# Patient Record
Sex: Female | Born: 1960
Health system: Southern US, Community
[De-identification: ages and names within clinical notes are randomized; demographics above are authoritative.]

## PROBLEM LIST (undated history)

## (undated) DIAGNOSIS — R011 Cardiac murmur, unspecified: Secondary | ICD-10-CM

## (undated) DIAGNOSIS — I10 Essential (primary) hypertension: Secondary | ICD-10-CM

## (undated) DIAGNOSIS — K219 Gastro-esophageal reflux disease without esophagitis: Secondary | ICD-10-CM

## (undated) DIAGNOSIS — R87629 Unspecified abnormal cytological findings in specimens from vagina: Secondary | ICD-10-CM

## (undated) DIAGNOSIS — T7840XA Allergy, unspecified, initial encounter: Secondary | ICD-10-CM

## (undated) DIAGNOSIS — M1990 Unspecified osteoarthritis, unspecified site: Secondary | ICD-10-CM

## (undated) DIAGNOSIS — K649 Unspecified hemorrhoids: Secondary | ICD-10-CM

## (undated) HISTORY — DX: Allergy, unspecified, initial encounter: T78.40XA

## (undated) HISTORY — DX: Unspecified osteoarthritis, unspecified site: M19.90

## (undated) HISTORY — PX: POLYPECTOMY: SHX149

## (undated) HISTORY — DX: Essential (primary) hypertension: I10

## (undated) HISTORY — DX: Unspecified hemorrhoids: K64.9

## (undated) HISTORY — PX: TUBAL LIGATION: SHX77

## (undated) HISTORY — PX: WISDOM TOOTH EXTRACTION: SHX21

## (undated) HISTORY — DX: Unspecified abnormal cytological findings in specimens from vagina: R87.629

## (undated) HISTORY — PX: APPENDECTOMY: SHX54

## (undated) HISTORY — DX: Gastro-esophageal reflux disease without esophagitis: K21.9

## (undated) HISTORY — DX: Cardiac murmur, unspecified: R01.1

## (undated) HISTORY — PX: COLPOSCOPY: SHX161

---

## 2012-05-10 HISTORY — PX: COLONOSCOPY: SHX174

## 2015-07-05 ENCOUNTER — Other Ambulatory Visit (HOSPITAL_COMMUNITY)
Admission: RE | Admit: 2015-07-05 | Discharge: 2015-07-05 | Disposition: A | Discharge status: Home / Self Care | Payer: Federal, State, Local not specified - PPO | Source: Ambulatory Visit | Attending: Nurse Practitioner | Admitting: Nurse Practitioner

## 2015-07-05 DIAGNOSIS — Z01419 Encounter for gynecological examination (general) (routine) without abnormal findings: Secondary | ICD-10-CM | POA: Insufficient documentation

## 2015-07-05 DIAGNOSIS — Z1151 Encounter for screening for human papillomavirus (HPV): Secondary | ICD-10-CM | POA: Insufficient documentation

## 2015-08-10 ENCOUNTER — Telehealth: Payer: Self-pay | Admitting: Internal Medicine

## 2015-08-10 NOTE — Telephone Encounter (Signed)
Received GI records and placed on Dr. Vena Rua desk for review.

## 2015-08-16 NOTE — Telephone Encounter (Signed)
Dr. Hilarie Fredrickson reviewed records and has accepted patient. Ok to schedule Direct colon. Left message for patient to return my call.

## 2015-09-06 ENCOUNTER — Encounter: Payer: Self-pay | Admitting: Internal Medicine

## 2015-11-17 ENCOUNTER — Ambulatory Visit (AMBULATORY_SURGERY_CENTER): Payer: Self-pay | Admitting: *Deleted

## 2015-11-17 ENCOUNTER — Encounter (INDEPENDENT_AMBULATORY_CARE_PROVIDER_SITE_OTHER): Payer: Self-pay

## 2015-11-17 VITALS — Ht 64.0 in | Wt 207.0 lb

## 2015-11-17 DIAGNOSIS — Z86010 Personal history of colonic polyps: Secondary | ICD-10-CM

## 2015-11-17 MED ORDER — NA SULFATE-K SULFATE-MG SULF 17.5-3.13-1.6 GM/177ML PO SOLN: 1.0000 | Freq: Once | ORAL | 0 refills | Status: AC

## 2015-11-17 NOTE — Progress Notes (Signed)
Denies allergies to eggs or soy products. Denies complications with sedation or anesthesia. Denies O2 use. Denies use of diet or weight loss medications.  Emmi instructions given for colonoscopy.  

## 2015-11-28 ENCOUNTER — Encounter: Payer: Self-pay | Admitting: Internal Medicine

## 2015-11-29 ENCOUNTER — Encounter: Payer: Federal, State, Local not specified - PPO | Admitting: Internal Medicine

## 2015-12-01 ENCOUNTER — Ambulatory Visit (AMBULATORY_SURGERY_CENTER): Payer: Federal, State, Local not specified - PPO | Admitting: Internal Medicine

## 2015-12-01 ENCOUNTER — Encounter: Payer: Self-pay | Admitting: Internal Medicine

## 2015-12-01 VITALS — BP 157/98 | HR 72 | Temp 98.0°F | Resp 16 | Ht 64.0 in | Wt 207.0 lb

## 2015-12-01 DIAGNOSIS — D122 Benign neoplasm of ascending colon: Secondary | ICD-10-CM | POA: Diagnosis not present

## 2015-12-01 DIAGNOSIS — Z86010 Personal history of colonic polyps: Secondary | ICD-10-CM

## 2015-12-01 DIAGNOSIS — K6389 Other specified diseases of intestine: Secondary | ICD-10-CM | POA: Diagnosis not present

## 2015-12-01 DIAGNOSIS — K639 Disease of intestine, unspecified: Secondary | ICD-10-CM

## 2015-12-01 MED ORDER — SODIUM CHLORIDE 0.9 % IV SOLN: 500.0000 mL | INTRAVENOUS | Status: DC

## 2015-12-01 NOTE — Progress Notes (Signed)
Called to room to assist during endoscopic procedure.  Patient ID and intended procedure confirmed with present staff. Received instructions for my participation in the procedure from the performing physician.  

## 2015-12-01 NOTE — Patient Instructions (Signed)
Colon polyp x 1 removed today, and colon biopsies taken. Result letter in your mail in 2-3 weeks.  Handout given on polyps. Resume current medications. Call us with any questions or concerns. Thank you!  YOU HAD AN ENDOSCOPIC PROCEDURE TODAY AT [facility]:   Refer to the procedure report that was given to you for any specific questions about what was found during the examination.  If the procedure report does not answer your questions, please call your gastroenterologist to clarify.  If you requested that your care partner not be given the details of your procedure findings, then the procedure report has been included in a sealed envelope for you to review at your convenience later.  YOU SHOULD EXPECT: Some feelings of bloating in the abdomen. Passage of more gas than usual.  Walking can help get rid of the air that was put into your GI tract during the procedure and reduce the bloating. If you had a lower endoscopy (such as a colonoscopy or flexible sigmoidoscopy) you may notice spotting of blood in your stool or on the toilet paper. If you underwent a bowel prep for your procedure, you may not have a normal bowel movement for a few days.  Please Note:  You might notice some irritation and congestion in your nose or some drainage.  This is from the oxygen used during your procedure.  There is no need for concern and it should clear up in a day or so.  SYMPTOMS TO REPORT IMMEDIATELY:   Following lower endoscopy (colonoscopy or flexible sigmoidoscopy):  Excessive amounts of blood in the stool  Significant tenderness or worsening of abdominal pains  Swelling of the abdomen that is new, acute  Fever of 100F or higher   For urgent or emergent issues, a gastroenterologist can be reached at any hour by calling [phone].   DIET:  We do recommend a small meal at first, but then you may proceed to your regular diet.  Drink plenty of fluids but you should avoid alcoholic  beverages for 24 hours.  ACTIVITY:  You should plan to take it easy for the rest of today and you should NOT DRIVE or use heavy machinery until tomorrow (because of the sedation medicines used during the test).    FOLLOW UP: Our staff will call the number listed on your records the next business day following your procedure to check on you and address any questions or concerns that you may have regarding the information given to you following your procedure. If we do not reach you, we will leave a message.  However, if you are feeling well and you are not experiencing any problems, there is no need to return our call.  We will assume that you have returned to your regular daily activities without incident.  If any biopsies were taken you will be contacted by phone or by letter within the next 1-3 weeks.  Please call us at [phone] if you have not heard about the biopsies in 3 weeks.    SIGNATURES/CONFIDENTIALITY: You and/or your care partner have signed paperwork which will be entered into your electronic medical record.  These signatures attest to the fact that that the information above on your After Visit Summary has been reviewed and is understood.  Full responsibility of the confidentiality of this discharge information lies with you and/or your care-partner.

## 2015-12-01 NOTE — Progress Notes (Signed)
Report to PACU, RN, vss, BBS= Clear.  

## 2015-12-01 NOTE — Op Note (Signed)
[facility] Patient Name: Eileen Andrews Procedure Date: 12/01/2015 9:12 AM MRN: [id number] Endoscopist: Jerene Bears , MD Age: 55 Referring MD:  Date of Birth: [birth year] Gender: Female Account #: [id number] Procedure:                Colonoscopy Indications:              High risk colon cancer surveillance: Personal                            history of sessile serrated colon polyp (less than                            10 mm in size) with no dysplasia, Last colonoscopy                            5 years ago Medicines:                Monitored Anesthesia Care Procedure:                Pre-Anesthesia Assessment:                           - Prior to the procedure, a History and Physical                            was performed, and patient medications and                            allergies were reviewed. The patient's tolerance of                            previous anesthesia was also reviewed. The risks                            and benefits of the procedure and the sedation                            options and risks were discussed with the patient.                            All questions were answered, and informed consent                            was obtained. Prior Anticoagulants: The patient has                            taken no previous anticoagulant or antiplatelet                            agents. ASA Grade Assessment: II - A patient with                            mild systemic disease. After reviewing the risks  and benefits, the patient was deemed in                            satisfactory condition to undergo the procedure.                           After obtaining informed consent, the colonoscope                            was passed under direct vision. Throughout the                            procedure, the patient's blood pressure, pulse, and                            oxygen saturations were monitored continuously.  The                            Model PCF-H190L [id number]) scope was introduced                            through the anus and advanced to the the cecum,                            identified by appendiceal orifice and ileocecal                            valve. The colonoscopy was performed without                            difficulty. The patient tolerated the procedure                            well. The quality of the bowel preparation was                            good. The ileocecal valve, appendiceal orifice, and                            rectum were photographed. Scope In: 9:22:48 AM Scope Out: 9:38:10 AM Scope Withdrawal Time: 0 hours 11 minutes 14 seconds  Total Procedure Duration: 0 hours 15 minutes 22 seconds  Findings:                 The digital rectal exam was normal.                           A 6 mm polyp was found in the ascending colon. The                            polyp was sessile. The polyp was removed with a                            cold snare. Resection and retrieval were complete.  Multiple small and large-mouthed diverticula were                            found in the sigmoid colon, descending colon and                            transverse colon. Erythema was seen in association                            with the diverticular opening in a segment of left                            colon (query SCAD). Biopsies were taken with a cold                            forceps for histology.                           Internal hemorrhoids were found during                            retroflexion. The hemorrhoids were small. Complications:            No immediate complications. Estimated Blood Loss:     Estimated blood loss was minimal. Impression:               - One 6 mm polyp in the ascending colon, removed                            with a cold snare. Resected and retrieved.                           - Moderate diverticulosis in the  sigmoid colon, in                            the descending colon and in the transverse colon.                            Erythema was seen in association with the                            diverticular opening in the left colon. Biopsied.                           - Internal hemorrhoids. Recommendation:           - Patient has a contact number available for                            emergencies. The signs and symptoms of potential                            delayed complications were discussed with the  patient. Return to normal activities tomorrow.                            Written discharge instructions were provided to the                            patient.                           - Resume previous diet.                           - Continue present medications.                           - Await pathology results.                           - Repeat colonoscopy is recommended for                            surveillance. The colonoscopy date will be                            determined after pathology results from today's                            exam become available for review. Jerene Bears, MD 12/01/2015 9:45:58 AM This report has been signed electronically.

## 2015-12-02 ENCOUNTER — Telehealth: Payer: Self-pay | Admitting: *Deleted

## 2015-12-02 NOTE — Telephone Encounter (Signed)
  Follow up Call-  Call back number 12/01/2015  Post procedure Call Back phone  # [phone]  Permission to leave phone message Yes     Patient questions:  Do you have a fever, pain , or abdominal swelling? No. Pain Score  0 *  Have you tolerated food without any problems? Yes.    Have you been able to return to your normal activities? Yes.    Do you have any questions about your discharge instructions: Diet   No. Medications  No. Follow up visit  No.  Do you have questions or concerns about your Care? No.  Actions: * If pain score is 4 or above: No action needed, pain <4.

## 2015-12-07 ENCOUNTER — Encounter: Payer: Self-pay | Admitting: Internal Medicine

## 2015-12-29 DIAGNOSIS — K080 Exfoliation of teeth due to systemic causes: Secondary | ICD-10-CM | POA: Diagnosis not present

## 2016-01-30 DIAGNOSIS — K080 Exfoliation of teeth due to systemic causes: Secondary | ICD-10-CM | POA: Diagnosis not present

## 2016-04-23 ENCOUNTER — Other Ambulatory Visit: Payer: Self-pay | Admitting: Internal Medicine

## 2016-04-23 DIAGNOSIS — Z1231 Encounter for screening mammogram for malignant neoplasm of breast: Secondary | ICD-10-CM

## 2016-05-03 DIAGNOSIS — E784 Other hyperlipidemia: Secondary | ICD-10-CM | POA: Diagnosis not present

## 2016-05-03 DIAGNOSIS — I10 Essential (primary) hypertension: Secondary | ICD-10-CM | POA: Diagnosis not present

## 2016-05-03 DIAGNOSIS — E669 Obesity, unspecified: Secondary | ICD-10-CM | POA: Diagnosis not present

## 2016-05-07 ENCOUNTER — Ambulatory Visit
Admission: RE | Admit: 2016-05-07 | Discharge: 2016-05-07 | Disposition: A | Discharge status: Home / Self Care | Payer: Federal, State, Local not specified - PPO | Source: Ambulatory Visit | Attending: Internal Medicine | Admitting: Internal Medicine

## 2016-05-07 DIAGNOSIS — Z1231 Encounter for screening mammogram for malignant neoplasm of breast: Secondary | ICD-10-CM

## 2016-07-03 DIAGNOSIS — K080 Exfoliation of teeth due to systemic causes: Secondary | ICD-10-CM | POA: Diagnosis not present

## 2016-07-30 DIAGNOSIS — E784 Other hyperlipidemia: Secondary | ICD-10-CM | POA: Diagnosis not present

## 2016-07-30 DIAGNOSIS — I10 Essential (primary) hypertension: Secondary | ICD-10-CM | POA: Diagnosis not present

## 2016-07-30 DIAGNOSIS — Z6835 Body mass index (BMI) 35.0-35.9, adult: Secondary | ICD-10-CM | POA: Diagnosis not present

## 2016-07-30 DIAGNOSIS — E668 Other obesity: Secondary | ICD-10-CM | POA: Diagnosis not present

## 2016-08-15 DIAGNOSIS — D2371 Other benign neoplasm of skin of right lower limb, including hip: Secondary | ICD-10-CM | POA: Diagnosis not present

## 2016-08-15 DIAGNOSIS — L918 Other hypertrophic disorders of the skin: Secondary | ICD-10-CM | POA: Diagnosis not present

## 2016-08-15 DIAGNOSIS — L820 Inflamed seborrheic keratosis: Secondary | ICD-10-CM | POA: Diagnosis not present

## 2016-08-15 DIAGNOSIS — L821 Other seborrheic keratosis: Secondary | ICD-10-CM | POA: Diagnosis not present

## 2016-08-15 DIAGNOSIS — D225 Melanocytic nevi of trunk: Secondary | ICD-10-CM | POA: Diagnosis not present

## 2016-08-15 DIAGNOSIS — D485 Neoplasm of uncertain behavior of skin: Secondary | ICD-10-CM | POA: Diagnosis not present

## 2016-10-16 DIAGNOSIS — R8299 Other abnormal findings in urine: Secondary | ICD-10-CM | POA: Diagnosis not present

## 2016-10-16 DIAGNOSIS — I10 Essential (primary) hypertension: Secondary | ICD-10-CM | POA: Diagnosis not present

## 2016-10-16 DIAGNOSIS — Z0000 Encounter for general adult medical examination without abnormal findings: Secondary | ICD-10-CM | POA: Diagnosis not present

## 2016-10-23 DIAGNOSIS — I10 Essential (primary) hypertension: Secondary | ICD-10-CM | POA: Diagnosis not present

## 2016-10-23 DIAGNOSIS — E668 Other obesity: Secondary | ICD-10-CM | POA: Diagnosis not present

## 2016-10-23 DIAGNOSIS — Z1389 Encounter for screening for other disorder: Secondary | ICD-10-CM | POA: Diagnosis not present

## 2016-10-23 DIAGNOSIS — M1990 Unspecified osteoarthritis, unspecified site: Secondary | ICD-10-CM | POA: Diagnosis not present

## 2016-10-23 DIAGNOSIS — Z0000 Encounter for general adult medical examination without abnormal findings: Secondary | ICD-10-CM | POA: Diagnosis not present

## 2016-10-23 DIAGNOSIS — E784 Other hyperlipidemia: Secondary | ICD-10-CM | POA: Diagnosis not present

## 2017-01-07 DIAGNOSIS — K080 Exfoliation of teeth due to systemic causes: Secondary | ICD-10-CM | POA: Diagnosis not present

## 2017-02-07 DIAGNOSIS — Z01419 Encounter for gynecological examination (general) (routine) without abnormal findings: Secondary | ICD-10-CM | POA: Diagnosis not present

## 2017-04-15 DIAGNOSIS — Z780 Asymptomatic menopausal state: Secondary | ICD-10-CM | POA: Diagnosis not present

## 2017-04-15 DIAGNOSIS — Z13820 Encounter for screening for osteoporosis: Secondary | ICD-10-CM | POA: Diagnosis not present

## 2017-07-11 DIAGNOSIS — K080 Exfoliation of teeth due to systemic causes: Secondary | ICD-10-CM | POA: Diagnosis not present

## 2017-10-10 DIAGNOSIS — Z1283 Encounter for screening for malignant neoplasm of skin: Secondary | ICD-10-CM | POA: Diagnosis not present

## 2017-10-10 DIAGNOSIS — D485 Neoplasm of uncertain behavior of skin: Secondary | ICD-10-CM | POA: Diagnosis not present

## 2017-10-10 DIAGNOSIS — D225 Melanocytic nevi of trunk: Secondary | ICD-10-CM | POA: Diagnosis not present

## 2017-10-22 DIAGNOSIS — I10 Essential (primary) hypertension: Secondary | ICD-10-CM | POA: Diagnosis not present

## 2017-10-22 DIAGNOSIS — Z0000 Encounter for general adult medical examination without abnormal findings: Secondary | ICD-10-CM | POA: Diagnosis not present

## 2017-10-22 DIAGNOSIS — R82998 Other abnormal findings in urine: Secondary | ICD-10-CM | POA: Diagnosis not present

## 2017-10-30 DIAGNOSIS — Z1389 Encounter for screening for other disorder: Secondary | ICD-10-CM | POA: Diagnosis not present

## 2017-10-30 DIAGNOSIS — Z86010 Personal history of colonic polyps: Secondary | ICD-10-CM | POA: Diagnosis not present

## 2017-10-30 DIAGNOSIS — E7849 Other hyperlipidemia: Secondary | ICD-10-CM | POA: Diagnosis not present

## 2017-10-30 DIAGNOSIS — Z0000 Encounter for general adult medical examination without abnormal findings: Secondary | ICD-10-CM | POA: Diagnosis not present

## 2017-10-30 DIAGNOSIS — E668 Other obesity: Secondary | ICD-10-CM | POA: Diagnosis not present

## 2017-10-30 DIAGNOSIS — I10 Essential (primary) hypertension: Secondary | ICD-10-CM | POA: Diagnosis not present

## 2017-11-18 ENCOUNTER — Other Ambulatory Visit: Payer: Self-pay | Admitting: Internal Medicine

## 2017-11-18 DIAGNOSIS — Z1231 Encounter for screening mammogram for malignant neoplasm of breast: Secondary | ICD-10-CM

## 2017-12-11 ENCOUNTER — Ambulatory Visit
Admission: RE | Admit: 2017-12-11 | Discharge: 2017-12-11 | Disposition: A | Discharge status: Home / Self Care | Payer: Federal, State, Local not specified - PPO | Source: Ambulatory Visit | Attending: Internal Medicine | Admitting: Internal Medicine

## 2017-12-11 DIAGNOSIS — Z1231 Encounter for screening mammogram for malignant neoplasm of breast: Secondary | ICD-10-CM

## 2018-01-16 DIAGNOSIS — K080 Exfoliation of teeth due to systemic causes: Secondary | ICD-10-CM | POA: Diagnosis not present

## 2018-02-03 ENCOUNTER — Emergency Department (HOSPITAL_COMMUNITY): Payer: Federal, State, Local not specified - PPO

## 2018-02-03 ENCOUNTER — Emergency Department (HOSPITAL_COMMUNITY)
Admission: EM | Admit: 2018-02-03 | Discharge: 2018-02-03 | Disposition: A | Discharge status: Home / Self Care | Payer: Federal, State, Local not specified - PPO | Attending: Emergency Medicine | Admitting: Emergency Medicine

## 2018-02-03 ENCOUNTER — Encounter (HOSPITAL_COMMUNITY): Payer: Self-pay | Admitting: Emergency Medicine

## 2018-02-03 ENCOUNTER — Other Ambulatory Visit: Payer: Self-pay

## 2018-02-03 ENCOUNTER — Telehealth: Payer: Self-pay | Admitting: Internal Medicine

## 2018-02-03 DIAGNOSIS — Z79899 Other long term (current) drug therapy: Secondary | ICD-10-CM | POA: Diagnosis not present

## 2018-02-03 DIAGNOSIS — K5732 Diverticulitis of large intestine without perforation or abscess without bleeding: Secondary | ICD-10-CM | POA: Diagnosis not present

## 2018-02-03 DIAGNOSIS — R1012 Left upper quadrant pain: Secondary | ICD-10-CM | POA: Diagnosis not present

## 2018-02-03 DIAGNOSIS — R112 Nausea with vomiting, unspecified: Secondary | ICD-10-CM | POA: Diagnosis not present

## 2018-02-03 DIAGNOSIS — K5792 Diverticulitis of intestine, part unspecified, without perforation or abscess without bleeding: Secondary | ICD-10-CM | POA: Insufficient documentation

## 2018-02-03 DIAGNOSIS — R1032 Left lower quadrant pain: Secondary | ICD-10-CM | POA: Diagnosis not present

## 2018-02-03 LAB — CBC WITH DIFFERENTIAL/PLATELET
Abs Immature Granulocytes: 0.03 10*3/uL (ref 0.00–0.07)
Basophils Absolute: 0 10*3/uL (ref 0.0–0.1)
Basophils Relative: 0 %
EOS PCT: 1 %
Eosinophils Absolute: 0.1 10*3/uL (ref 0.0–0.5)
HEMATOCRIT: 40.6 % (ref 36.0–46.0)
HEMOGLOBIN: 12.8 g/dL (ref 12.0–15.0)
Immature Granulocytes: 0 %
LYMPHS ABS: 1.4 10*3/uL (ref 0.7–4.0)
LYMPHS PCT: 17 %
MCH: 30.4 pg (ref 26.0–34.0)
MCHC: 31.5 g/dL (ref 30.0–36.0)
MCV: 96.4 fL (ref 80.0–100.0)
MONO ABS: 0.5 10*3/uL (ref 0.1–1.0)
MONOS PCT: 5 %
Neutro Abs: 6.5 10*3/uL (ref 1.7–7.7)
Neutrophils Relative %: 77 %
PLATELETS: 156 10*3/uL (ref 150–400)
RBC: 4.21 MIL/uL (ref 3.87–5.11)
RDW: 12.2 % (ref 11.5–15.5)
WBC: 8.4 10*3/uL (ref 4.0–10.5)
nRBC: 0 % (ref 0.0–0.2)

## 2018-02-03 LAB — URINALYSIS, ROUTINE W REFLEX MICROSCOPIC
BILIRUBIN URINE: NEGATIVE
Glucose, UA: NEGATIVE mg/dL
KETONES UR: NEGATIVE mg/dL
Nitrite: NEGATIVE
PROTEIN: NEGATIVE mg/dL
Specific Gravity, Urine: 1.019 (ref 1.005–1.030)
pH: 5 (ref 5.0–8.0)

## 2018-02-03 LAB — COMPREHENSIVE METABOLIC PANEL
ALT: 20 U/L (ref 0–44)
AST: 19 U/L (ref 15–41)
Albumin: 3.9 g/dL (ref 3.5–5.0)
Alkaline Phosphatase: 68 U/L (ref 38–126)
Anion gap: 7 (ref 5–15)
BUN: 11 mg/dL (ref 6–20)
CHLORIDE: 105 mmol/L (ref 98–111)
CO2: 26 mmol/L (ref 22–32)
CREATININE: 0.93 mg/dL (ref 0.44–1.00)
Calcium: 9 mg/dL (ref 8.9–10.3)
GFR calc Af Amer: >60 mL/min (ref >60)
GLUCOSE: 105 mg/dL — AB (ref 70–99)
Potassium: 4 mmol/L (ref 3.5–5.1)
SODIUM: 138 mmol/L (ref 135–145)
Total Bilirubin: 1 mg/dL (ref 0.3–1.2)
Total Protein: 6.9 g/dL (ref 6.5–8.1)

## 2018-02-03 LAB — LIPASE, BLOOD: Lipase: 37 U/L (ref 11–51)

## 2018-02-03 MED ORDER — AMOXICILLIN-POT CLAVULANATE 875-125 MG PO TABS: 1.0000 | ORAL_TABLET | Freq: Two times a day (BID) | ORAL | 0 refills | Status: AC

## 2018-02-03 MED ORDER — ONDANSETRON HCL 4 MG/2ML IJ SOLN
4.0000 mg | Freq: Once | INTRAMUSCULAR | Status: AC
  Administered 2018-02-03: 4 mg via INTRAVENOUS
  Filled 2018-02-03: qty 2

## 2018-02-03 MED ORDER — AMOXICILLIN-POT CLAVULANATE 875-125 MG PO TABS
1.0000 | ORAL_TABLET | Freq: Once | ORAL | Status: AC
  Administered 2018-02-03: 1 via ORAL
  Filled 2018-02-03: qty 1

## 2018-02-03 MED ORDER — MORPHINE SULFATE (PF) 4 MG/ML IV SOLN
4.0000 mg | INTRAVENOUS | Status: DC | PRN
  Administered 2018-02-03: 4 mg via INTRAVENOUS
  Filled 2018-02-03: qty 1

## 2018-02-03 MED ORDER — SODIUM CHLORIDE 0.9 % IV BOLUS
1000.0000 mL | Freq: Once | INTRAVENOUS | Status: AC
  Administered 2018-02-03: 1000 mL via INTRAVENOUS

## 2018-02-03 MED ORDER — IOHEXOL 300 MG/ML  SOLN
100.0000 mL | Freq: Once | INTRAMUSCULAR | Status: AC | PRN
  Administered 2018-02-03: 100 mL via INTRAVENOUS

## 2018-02-03 MED ORDER — ONDANSETRON 4 MG PO TBDP
4.0000 mg | ORAL_TABLET | Freq: Once | ORAL | Status: AC
Start: 2018-02-03 — End: 2018-02-03
  Administered 2018-02-03: 4 mg via ORAL
  Filled 2018-02-03: qty 1

## 2018-02-03 MED ORDER — HYDROCODONE-ACETAMINOPHEN 5-325 MG PO TABS: 2.0000 | ORAL_TABLET | ORAL | 0 refills | Status: DC | PRN

## 2018-02-03 MED ORDER — ONDANSETRON 4 MG PO TBDP: 4.0000 mg | ORAL_TABLET | Freq: Three times a day (TID) | ORAL | 0 refills | Status: DC | PRN

## 2018-02-03 NOTE — ED Triage Notes (Signed)
Pt arrives to ED from home with complaints of LLQ abdominal pain for three days now. Pt reports hx of diverticulitis. Pt placed in position of comfort with bed locked and lowered, call bell in reach.

## 2018-02-03 NOTE — Telephone Encounter (Signed)
Pt states she started having LLQ pain last night and she couldn't sleep. She states it hurts for her to stand up straight and going over bumps in the road hurts. States she is otw to the hospital but wanted to know if there was something else she should have done. Discussed with her that she should proceed to the ER, they will probably do a CT scan to see if it is diverticulitis and give her antibiotics if that is the diagnosis. Pt verbalized understanding.

## 2018-02-03 NOTE — ED Notes (Signed)
Patient verbalizes understanding of discharge instructions. Opportunity for questioning and answers were provided. Armband removed by staff, pt discharged from ED.  

## 2018-02-03 NOTE — Discharge Instructions (Addendum)
Home to rest. Take Augmentin as prescribed and complete the full course. Take Zofran as needed for nausea/vomiting, take Norco as needed as prescribe for pain. Return to ER for fever, worsening pain, unable to control vomiting at home, any worsening symptoms. Follow up with your doctor in the next week.

## 2018-02-03 NOTE — Telephone Encounter (Signed)
Pt states that she is having a UC flare up and it is very painful, she needs some advise.

## 2018-02-03 NOTE — ED Provider Notes (Signed)
[facility] Provider Note   CSN: [id number] Arrival date & time: 02/03/18  0909     History   Chief Complaint Chief Complaint  Patient presents with  . Abdominal Pain    HPI Eileen Andrews is a 57 y.o. female.  57 year old female presents with complaint of left-sided abdominal pain onset yesterday, worse since last night.  Pain is constant, dull and sharp, worse with standing and movement, nothing makes pain better, radiates to left flank.  Pain is associated with nausea, vomiting.  Denies changes in bowel or bladder habits, denies blood in her stools, denies fevers, chills.  Patient has been told on prior colonoscopies that she has diverticuli, has never had diverticulitis. Prior abdominal surgeries include tuballigation and appendectomy. No other complaints or concerns.      Past Medical History:  Diagnosis Date  . Acid reflux   . Heart murmur   . Hemorrhoids     There are no active problems to display for this patient.   Past Surgical History:  Procedure Laterality Date  . APPENDECTOMY    . COLONOSCOPY  05/2012  . TUBAL LIGATION    . VAGINAL DELIVERY    . WISDOM TOOTH EXTRACTION       OB History   None      Home Medications    Prior to Admission medications   Medication Sig Start Date End Date Taking? Authorizing Provider  losartan (COZAAR) 50 MG tablet Take 50 mg by mouth daily. 11/29/17  Yes [provider]  amoxicillin-clavulanate (AUGMENTIN) 875-125 MG tablet Take 1 tablet by mouth every 12 (twelve) hours for 10 days. 02/03/18 02/13/18  Tacy Learn, PA-C  HYDROcodone-acetaminophen (NORCO/VICODIN) 5-325 MG tablet Take 2 tablets by mouth every 4 (four) hours as needed. 02/03/18   Tacy Learn, PA-C  ondansetron (ZOFRAN ODT) 4 MG disintegrating tablet Take 1 tablet (4 mg total) by mouth every 8 (eight) hours as needed for nausea or vomiting. 02/03/18   Tacy Learn, PA-C    Family  History Family History  Problem Relation Age of Onset  . Colon cancer Neg Hx     Social History Social History   Tobacco Use  . Smoking status: Never Smoker  . Smokeless tobacco: Never Used  Substance Use Topics  . Alcohol use: Yes    Alcohol/week: 7.0 - 8.0 standard drinks    Types: 7 - 8 Glasses of wine per week  . Drug use: No     Allergies   Patient has no known allergies.   Review of Systems Review of Systems  Constitutional: Negative for chills and fever.  Respiratory: Negative for shortness of breath.   Cardiovascular: Negative for chest pain.  Gastrointestinal: Positive for abdominal pain, nausea and vomiting. Negative for abdominal distention, blood in stool, constipation and diarrhea.  Genitourinary: Positive for flank pain. Negative for difficulty urinating, dysuria, frequency, hematuria and urgency.  Musculoskeletal: Negative for arthralgias and myalgias.  Skin: Negative for rash and wound.  Allergic/Immunologic: Negative for immunocompromised state.  Hematological: Does not bruise/bleed easily.  Psychiatric/Behavioral: Negative for confusion.  All other systems reviewed and are negative.    Physical Exam Updated Vital Signs BP (!) 144/80 (BP Location: Right Arm)   Pulse 77   Temp 99 F (37.2 C) (Oral)   Resp 12   Ht 5\' 4"  (1.626 m)   Wt 86.2 kg   LMP 03/12/2008   SpO2 96%   BMI 32.61 kg/m   Physical Exam  Constitutional: She is oriented to person, place, and time. She appears well-developed and well-nourished. No distress.  HENT:  Head: Normocephalic and atraumatic.  Cardiovascular: Normal rate, regular rhythm, normal heart sounds and intact distal pulses.  No murmur heard. Pulmonary/Chest: Effort normal and breath sounds normal.  Abdominal: Normal appearance. There is tenderness in the left upper quadrant and left lower quadrant. There is no CVA tenderness.  Neurological: She is alert and oriented to person, place, and time.  Skin: Skin is  warm and dry. She is not diaphoretic.  Psychiatric: She has a normal mood and affect. Her behavior is normal.  Nursing note and vitals reviewed.    ED Treatments / Results  Labs (all labs ordered are listed, but only abnormal results are displayed) Labs Reviewed  COMPREHENSIVE METABOLIC PANEL - Abnormal; Notable for the following components:      Result Value   Glucose, Bld 105 (*)    All other components within normal limits  URINALYSIS, ROUTINE W REFLEX MICROSCOPIC - Abnormal; Notable for the following components:   Hgb urine dipstick MODERATE (*)    Leukocytes, UA MODERATE (*)    Bacteria, UA RARE (*)    All other components within normal limits  CBC WITH DIFFERENTIAL/PLATELET  LIPASE, BLOOD    EKG None  Radiology Ct Abdomen Pelvis W Contrast  Result Date: 02/03/2018 CLINICAL DATA:  Abdominal pain.  Rule out diverticulitis EXAM: CT ABDOMEN AND PELVIS WITH CONTRAST TECHNIQUE: Multidetector CT imaging of the abdomen and pelvis was performed using the standard protocol following bolus administration of intravenous contrast. CONTRAST:  164mL OMNIPAQUE IOHEXOL 300 MG/ML  SOLN COMPARISON:  None. FINDINGS: Lower chest: Calcified granuloma right middle lobe. Lungs otherwise clear Hepatobiliary: No focal liver abnormality is seen. No gallstones, gallbladder wall thickening, or biliary dilatation. Pancreas: Negative Spleen: Negative Adrenals/Urinary Tract: Adrenal glands are unremarkable. Kidneys are normal, without renal calculi, focal lesion, or hydronephrosis. Bladder is unremarkable. Stomach/Bowel: Segmental thickening of the mid left colon with edema in the surrounding fat. Findings compatible with diverticulitis. No abscess or free air. Extensive diverticular change in the sigmoid colon without thickening. Postop appendectomy. Negative for bowel obstruction. 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 Vascular/Lymphatic: No significant vascular findings are present. No enlarged  abdominal or pelvic lymph nodes. Reproductive: Normal uterus. No pelvic mass. Mild amount of free fluid in the pelvis right greater than left Other: None Musculoskeletal: Negative IMPRESSION: Acute diverticulitis mid left colon without abscess. Extensive diverticulosis in the sigmoid colon.  Postop appendectomy Mild free fluid in the pelvis, possibly related to ovarian cyst rupture. Electronically Signed   By: Franchot Gallo M.D.   On: 02/03/2018 14:26    Procedures Procedures (including critical care time)  Medications Ordered in ED Medications  morphine 4 MG/ML injection 4 mg (4 mg Intravenous Given 02/03/18 0953)  ondansetron (ZOFRAN) injection 4 mg (4 mg Intravenous Given 02/03/18 0953)  sodium chloride 0.9 % bolus 1,000 mL (0 mLs Intravenous Stopped 02/03/18 1436)  iohexol (OMNIPAQUE) 300 MG/ML solution 100 mL (100 mLs Intravenous Contrast Given 02/03/18 1236)  amoxicillin-clavulanate (AUGMENTIN) 875-125 MG per tablet 1 tablet (1 tablet Oral Given 02/03/18 1445)  ondansetron (ZOFRAN-ODT) disintegrating tablet 4 mg (4 mg Oral Given 02/03/18 1445)     Initial Impression / Assessment and Plan / ED Course  I have reviewed the triage vital signs and the nursing notes.  Pertinent labs & imaging results that were available during my care of the patient were reviewed by me and considered in  my medical decision making (see chart for details).  Clinical Course as of Feb 03 1500  Mon Feb 04, 2332  1973 57 year old female with history of diverticuli presents with complaint of left lower quadrant pain with nausea, vomiting.  Denies changes in bowel or bladder habits, fevers, chills, blood in her stools.  Exam patient has left-sided abdominal tenderness.  Review of lab work, CBC and CMP without significant changes, lipase normal, urinalysis with moderate hemoglobin, moderate leukocytes with rare bacteria.  CT with IV contrast abdomen and pelvis shows diverticulitis without abscess.  Discussed results  and plan of care with patient.  Patient feels well enough for discharge home at this time, she was discharged home with her scription for Augmentin, Zofran, Norco.  Patient advised return to emergency room for fevers, uncontrollable vomiting, worsening pain or any other concerning symptoms.  Otherwise patient to follow-up with her primary care provider next week.  Patient verbalizes understanding of discharge instructions and plan.   [LM]    Clinical Course User Index [LM] Tacy Learn, PA-C   Final Clinical Impressions(s) / ED Diagnoses   Final diagnoses:  Diverticulitis    ED Discharge Orders         Ordered    amoxicillin-clavulanate (AUGMENTIN) 875-125 MG tablet  Every 12 hours     02/03/18 1443    ondansetron (ZOFRAN ODT) 4 MG disintegrating tablet  Every 8 hours PRN     02/03/18 1443    HYDROcodone-acetaminophen (NORCO/VICODIN) 5-325 MG tablet  Every 4 hours PRN     02/03/18 1443           Tacy Learn, PA-C 02/03/18 1501    Julianne Rice, MD 02/04/18 2254

## 2018-11-12 DIAGNOSIS — Z0000 Encounter for general adult medical examination without abnormal findings: Secondary | ICD-10-CM | POA: Diagnosis not present

## 2018-11-12 DIAGNOSIS — I10 Essential (primary) hypertension: Secondary | ICD-10-CM | POA: Diagnosis not present

## 2018-11-12 DIAGNOSIS — E7849 Other hyperlipidemia: Secondary | ICD-10-CM | POA: Diagnosis not present

## 2018-11-24 DIAGNOSIS — Z86010 Personal history of colonic polyps: Secondary | ICD-10-CM | POA: Diagnosis not present

## 2018-11-24 DIAGNOSIS — Z1331 Encounter for screening for depression: Secondary | ICD-10-CM | POA: Diagnosis not present

## 2018-11-24 DIAGNOSIS — E669 Obesity, unspecified: Secondary | ICD-10-CM | POA: Diagnosis not present

## 2018-11-24 DIAGNOSIS — E785 Hyperlipidemia, unspecified: Secondary | ICD-10-CM | POA: Diagnosis not present

## 2018-11-24 DIAGNOSIS — I10 Essential (primary) hypertension: Secondary | ICD-10-CM | POA: Diagnosis not present

## 2018-11-24 DIAGNOSIS — Z0000 Encounter for general adult medical examination without abnormal findings: Secondary | ICD-10-CM | POA: Diagnosis not present

## 2018-12-04 ENCOUNTER — Other Ambulatory Visit: Payer: Self-pay | Admitting: Nurse Practitioner

## 2018-12-04 ENCOUNTER — Other Ambulatory Visit (HOSPITAL_COMMUNITY)
Admission: RE | Admit: 2018-12-04 | Discharge: 2018-12-04 | Disposition: A | Discharge status: Home / Self Care | Payer: Federal, State, Local not specified - PPO | Source: Ambulatory Visit | Attending: Nurse Practitioner | Admitting: Nurse Practitioner

## 2018-12-04 DIAGNOSIS — Z124 Encounter for screening for malignant neoplasm of cervix: Secondary | ICD-10-CM | POA: Insufficient documentation

## 2018-12-04 DIAGNOSIS — Z01419 Encounter for gynecological examination (general) (routine) without abnormal findings: Secondary | ICD-10-CM | POA: Diagnosis not present

## 2018-12-11 LAB — CYTOLOGY - PAP
Diagnosis: NEGATIVE
High risk HPV: NEGATIVE
Molecular Disclaimer: 56
Molecular Disclaimer: NORMAL

## 2018-12-26 DIAGNOSIS — D485 Neoplasm of uncertain behavior of skin: Secondary | ICD-10-CM | POA: Diagnosis not present

## 2018-12-26 DIAGNOSIS — B079 Viral wart, unspecified: Secondary | ICD-10-CM | POA: Diagnosis not present

## 2018-12-26 DIAGNOSIS — Z1283 Encounter for screening for malignant neoplasm of skin: Secondary | ICD-10-CM | POA: Diagnosis not present

## 2018-12-26 DIAGNOSIS — D225 Melanocytic nevi of trunk: Secondary | ICD-10-CM | POA: Diagnosis not present

## 2019-02-11 DIAGNOSIS — Z20828 Contact with and (suspected) exposure to other viral communicable diseases: Secondary | ICD-10-CM | POA: Diagnosis not present

## 2019-03-24 ENCOUNTER — Ambulatory Visit: Payer: Federal, State, Local not specified - PPO | Admitting: Nurse Practitioner

## 2019-03-24 ENCOUNTER — Other Ambulatory Visit (INDEPENDENT_AMBULATORY_CARE_PROVIDER_SITE_OTHER): Payer: Federal, State, Local not specified - PPO

## 2019-03-24 ENCOUNTER — Ambulatory Visit (HOSPITAL_BASED_OUTPATIENT_CLINIC_OR_DEPARTMENT_OTHER)
Admission: RE | Admit: 2019-03-24 | Discharge: 2019-03-24 | Disposition: A | Discharge status: Home / Self Care | Payer: Federal, State, Local not specified - PPO | Source: Ambulatory Visit | Attending: Nurse Practitioner | Admitting: Nurse Practitioner

## 2019-03-24 ENCOUNTER — Other Ambulatory Visit: Payer: Self-pay

## 2019-03-24 ENCOUNTER — Encounter: Payer: Self-pay | Admitting: Nurse Practitioner

## 2019-03-24 VITALS — BP 134/78 | HR 66 | Temp 97.6°F | Ht 65.0 in | Wt 207.0 lb

## 2019-03-24 DIAGNOSIS — R1012 Left upper quadrant pain: Secondary | ICD-10-CM

## 2019-03-24 DIAGNOSIS — R1032 Left lower quadrant pain: Secondary | ICD-10-CM | POA: Diagnosis not present

## 2019-03-24 DIAGNOSIS — K5732 Diverticulitis of large intestine without perforation or abscess without bleeding: Secondary | ICD-10-CM | POA: Diagnosis not present

## 2019-03-24 LAB — CBC WITH DIFFERENTIAL/PLATELET
Basophils Absolute: 0 10*3/uL (ref 0.0–0.1)
Basophils Relative: 0.6 % (ref 0.0–3.0)
Eosinophils Absolute: 0.1 10*3/uL (ref 0.0–0.7)
Eosinophils Relative: 2.1 % (ref 0.0–5.0)
HCT: 37.7 % (ref 36.0–46.0)
Hemoglobin: 12.8 g/dL (ref 12.0–15.0)
Lymphocytes Relative: 25 % (ref 12.0–46.0)
Lymphs Abs: 1.7 10*3/uL (ref 0.7–4.0)
MCHC: 33.9 g/dL (ref 30.0–36.0)
MCV: 93.8 fl (ref 78.0–100.0)
Monocytes Absolute: 0.3 10*3/uL (ref 0.1–1.0)
Monocytes Relative: 4.9 % (ref 3.0–12.0)
Neutro Abs: 4.6 10*3/uL (ref 1.4–7.7)
Neutrophils Relative %: 67.4 % (ref 43.0–77.0)
Platelets: 174 10*3/uL (ref 150.0–400.0)
RBC: 4.02 Mil/uL (ref 3.87–5.11)
RDW: 12.6 % (ref 11.5–15.5)
WBC: 6.9 10*3/uL (ref 4.0–10.5)

## 2019-03-24 LAB — COMPREHENSIVE METABOLIC PANEL
ALT: 15 U/L (ref 0–35)
AST: 11 U/L (ref 0–37)
Albumin: 4.1 g/dL (ref 3.5–5.2)
Alkaline Phosphatase: 72 U/L (ref 39–117)
BUN: 13 mg/dL (ref 6–23)
CO2: 26 mEq/L (ref 19–32)
Calcium: 9.2 mg/dL (ref 8.4–10.5)
Chloride: 105 mEq/L (ref 96–112)
Creatinine, Ser: 0.76 mg/dL (ref 0.40–1.20)
GFR: 77.82 mL/min (ref >60.00)
Glucose, Bld: 99 mg/dL (ref 70–99)
Potassium: 3.9 mEq/L (ref 3.5–5.1)
Sodium: 139 mEq/L (ref 135–145)
Total Bilirubin: 0.7 mg/dL (ref 0.2–1.2)
Total Protein: 7 g/dL (ref 6.0–8.3)

## 2019-03-24 LAB — C-REACTIVE PROTEIN: CRP: 8.7 mg/dL (ref 0.5–20.0)

## 2019-03-24 MED ORDER — AMOXICILLIN-POT CLAVULANATE 875-125 MG PO TABS: 1.0000 | ORAL_TABLET | Freq: Two times a day (BID) | ORAL | 0 refills | Status: DC

## 2019-03-24 NOTE — Progress Notes (Signed)
0 0   03/24/2019 Eileen Andrews LJ:[id number] Apr 14, 1960+   HISTORY OF PRESENT ILLNESS: Eileen Andrews is a 59 year old female with a past medical history of hypertension, GERD, colon polyps and diverticulitis. Past appendectomy and tubal ligation.  She presents today with complaints of having left lower quadrant abdominal pain which started yesterday morning.  She is concerned that she is having another diverticulitis flare.  She describes the pain as a sharp overwhelming pain that comes and goes in waves.  She had difficulty sleeping throughout the night due to having ongoing lower abdominal pain.  She has chills without sweats.  No fever.  She took Aleve 220 mg 2 tabs 3 times yesterday and at 7 AM today with minimal relief.  She denies having any nausea or vomiting.  No constipation.  She is passing a normal formed brown movement daily.  No rectal bleeding or melena.   Her most recent colonoscopy was 12/01/2015 which identified a 6 mm tubular adenomatous polyp to the ascending colon, moderate diverticulosis in the sigmoid, descending and transverse colon.  She denies any family history of colorectal cancer. She has a history of GERD.  She takes Omeprazole 20 mg most days.  She takes Tums infrequently, approximately 5 tabs monthly. No dysuria or hematuria.   Her first episode of diverticulitis was confirmed by an abdominal/pelvic CT 02/03/2018 done at [facility] emergency room.  WBC was 8.4.  At that time, she primarily described having left lower quadrant abdominal pain.  She was prescribed Augmentin 875 mg 1 p.o. twice daily for 10 days.  She took the Augmentin for 8 to 9 days and her abdominal pain completely resolved.  Since that time, she reported having 2 flares of diverticulitis when she did not seek medical attention.  In the summer 2020 she developed left lower quadrant abdominal pain which radiated to her left lower back and lasted for 3 to 4 days then resolved after taking  Aleve.  She had another episode of left lower abdominal pain November 2020 and she took Augmentin 875mg  1 tab po bid x 1 day which was left over from her 01/2018 diverticulitis prescription. She took Aleve for 3 days and her abdominal pain abated, however she felt "a full feeling" to her lower abdomen for 2-3 additional days which eventually resolved.   She wishes to avoid going to the hospital setting for CT imaging or to the ED during the Covid 19 pandemic.   Abdominal/pelvic CT 02/03/2018: Acute diverticulitis mid left colon without abscess. Extensive diverticulosis in the sigmoid colon.  Postop appendectomy Mild free fluid in the pelvis, possibly related to ovarian cyst rupture. 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 Calcified granuloma right middle lobe.   Colonoscopy 12/01/2015 by Dr. Hilarie Fredrickson:  - One 6 mm tubular adenomatous polyp in the ascending colon. - Moderate diverticulosis in the sigmoid colon, in the descending colon and in the transverse colon. Erythema was seen in association with the diverticular opening in the left colon. - Internal hemorrhoids. -Recall colonoscopy 5 years.  Colonoscopy 06/06/2012 by Dr. Zigmund Daniel: -One 13mm sessile serrated polyp across from the IC valve. No evidence of HGD.  -One 1.2 cm probable sessile serrated adenoma at the splenic flexure. Biopsies showed benign colonic mucosa mixed with fecal material without adenomatous epithelium.  -Diverticulosis and internal hemorrhoids.  Past Medical History:  Diagnosis Date  . Acid reflux   . Heart murmur   . Hemorrhoids    Past Surgical History:  Procedure Laterality Date  . APPENDECTOMY    . COLONOSCOPY  05/2012  . TUBAL LIGATION    . VAGINAL DELIVERY    . WISDOM TOOTH EXTRACTION      reports that she has never smoked. She has never used smokeless tobacco. She reports current alcohol use of about 7.0 - 8.0 standard drinks of alcohol per week. She reports that she does not use  drugs. family history is not on file. No Known Allergies    Outpatient Encounter Medications as of 03/24/2019  Medication Sig  . HYDROcodone-acetaminophen (NORCO/VICODIN) 5-325 MG tablet Take 2 tablets by mouth every 4 (four) hours as needed.  Marland Kitchen losartan (COZAAR) 50 MG tablet Take 50 mg by mouth daily.  . ondansetron (ZOFRAN ODT) 4 MG disintegrating tablet Take 1 tablet (4 mg total) by mouth every 8 (eight) hours as needed for nausea or vomiting.   Facility-Administered Encounter Medications as of 03/24/2019  Medication  . 0.9 %  sodium chloride infusion    REVIEW OF SYSTEMS  :  Gen: Denies fever, sweats or chills. No weight loss.  CV: Denies chest pain, palpitations or edema. Resp: Denies cough, shortness of breath of hemoptysis.  GI: See HPI. GU : Denies urinary burning, blood in urine, increased urinary frequency or incontinence. MS: Denies joint pain, muscles aches or weakness. Derm: Denies rash, itchiness, skin lesions or unhealing ulcers. Psych: Denies depression, anxiety, memory loss, suicidal ideation and confusion. Heme: Denies bruising, bleeding. Neuro:  Denies headaches, dizziness or paresthesias. Endo:  Denies any problems with DM, thyroid or adrenal function.  PHYSICAL EXAM: LMP 03/12/2008   BP 134/78   Pulse 66   Temp 97.6 F (36.4 C)   Ht 5\' 5"  (1.651 m)   Wt 207 lb (93.9 kg)   LMP 03/12/2008   BMI 34.45 kg/m  General: Well developed 59 year old female in no acute distress. Head: Normocephalic and atraumatic. Eyes:  Sclerae non-icteric, conjunctive pink. Ears: Normal auditory acuity. Neck: Supple, no lymphadenopathy or thyromegaly.  Lungs: Clear bilaterally to auscultation without wheezes, crackles or rhonchi. Heart: Regular rate and rhythm, 1/6 systolic murmur. No rubs or gallops appreciated.  Abdomen: Soft, nondistended, moderated tenderness LUQ, epigastric with increased tenderness throughout the lower abdomen > central lower abdomen without rebound or  guarding. Negative shake tenderness. Patient near tears when palpating LUQ and LLQ. No masses. No hepatosplenomegaly. Normoactive bowel sounds x 4 quadrants.  Rectal: Deferred. Musculoskeletal: Symmetrical with no gross deformities. Skin: Warm and dry. No rash or lesions on visible extremities. Extremities: No edema. Neurological: Alert oriented x 4, no focal deficits.  Psychological:  Alert and cooperative. Normal mood and affect.  ASSESSMENT AND PLAN:  38.  59 year old female with a history of sigmoid diverticulitis presents with left lower quadrant abdominal pain, moderate tenderness to the upper and lower abdomen on exam without rebound or guarding. -CBC, CMP and CRP -Abdominal/pelvic CT with oral contrast only today at [facility] (IV contrast not ordered as BUN and creatinine results will not be available today) -Augmentin 875 mg 1 p.o. twice daily for 14 days -Florastor probiotic 1 p.o. twice daily -Push fluids, bland diet for 24 hours -Patient to go to the emergency room if severe abdominal pain occurs -Patient to follow-up in the office in 2 weeks, will schedule a colonoscopy in 6 to 8 weeks -To consider a consult with a colorectal surgeon if her abdominal/pelvic CT confirms sigmoid diverticulitis  2. History of colon polyps -See plan in #1  3.  GERD -Continue omeprazole 20 mg daily, Tums as needed -Further discuss GERD symptoms at time of follow-up appointment in 2 weeks   CC:  Velna Hatchet, MD

## 2019-03-24 NOTE — Patient Instructions (Addendum)
If you are age 60 or older, your body mass index should be between 23-30. Your There is no height or weight on file to calculate BMI. If this is out of the aforementioned range listed, please consider follow up with your Primary Care Provider.  If you are age 6 or younger, your body mass index should be between 19-25. Your There is no height or weight on file to calculate BMI. If this is out of the aformentioned range listed, please consider follow up with your Primary Care Provider.   You have been scheduled for a CT scan of the abdomen and pelvis at [place] are scheduled today, 01/22/2020 at 3:30pm. You should arrive 15 minutes prior to your appointment time for registration. Please follow the written instructions below on the day of your exam:  WARNING: IF YOU ARE ALLERGIC TO IODINE/X-RAY DYE, PLEASE NOTIFY RADIOLOGY IMMEDIATELY AT [phone]! YOU WILL BE GIVEN A 13 HOUR PREMEDICATION PREP.  1) Do not eat or drink anything after 10:30 am (4 hours prior to your test) 2) You have been given 2 bottles of oral contrast to drink. The solution may taste better if refrigerated, but do NOT add ice or any other liquid to this solution. Shake well before drinking.    Drink 1 bottle of contrast @ 1:30 pm(2 hours prior to your exam)  Drink 1 bottle of contrast @ 2:30 pm (1 hour prior to your exam)  You may take any medications as prescribed with a small amount of water, if necessary. If you take any of the following medications: METFORMIN, GLUCOPHAGE, [place], AVANDAMET, RIOMET, FORTAMET, [place] MET, JANUMET, GLUMETZA or METAGLIP, you MAY be asked to HOLD this medication 48 hours AFTER the exam.  We have sent the following medications to your pharmacy for you to pick up at your convenience:  Augmentin 875 mg 1 tablet by mouth twice daily.  Please pick up the probiotic Harriette Ohara which is over the counter and take it twice daily. Push fluids and start a bland diet for 24  hours Follow up in 2 weeks with me, Go to the ER if symptoms worsen.   The purpose of you drinking the oral contrast is to aid in the visualization of your intestinal tract. The contrast solution may cause some diarrhea. Depending on your individual set of symptoms, you may also receive an intravenous injection of x-ray contrast/dye. Plan on being at [facility] for 30 minutes or longer, depending on the type of exam you are having performed.  This test typically takes 30-45 minutes to complete.  If you have any questions regarding your exam or if you need to reschedule, you may call the CT department at [phone] between the hours of 8:00 am and 5:00 pm, Monday-Friday.  ________________________________________________________________________  Your provider has requested that you go to the basement level of [facility] on [address] in [place] for lab work. . Press "B" on the elevator. The lab is located at the first door on the left as you exit the elevator.   Due to recent changes in healthcare laws, you may see the results of your imaging and laboratory studies on MyChart before your provider has had a chance to review them.  We understand that in some cases there may be results that are confusing or concerning to you. Not all laboratory results come back in the same time frame and the provider may be waiting for multiple results in order to interpret others.  Please give [place] 48 hours in order for your provider to thoroughly review all the results before contacting the office for clarification of your results.

## 2019-04-02 NOTE — Progress Notes (Signed)
Addendum: Reviewed and agree with assessment and management plan. Jamoni Broadfoot M, MD  

## 2019-04-07 ENCOUNTER — Ambulatory Visit: Payer: Federal, State, Local not specified - PPO | Admitting: Nurse Practitioner

## 2019-05-14 ENCOUNTER — Ambulatory Visit: Payer: Federal, State, Local not specified - PPO | Admitting: Nurse Practitioner

## 2019-08-27 DIAGNOSIS — K080 Exfoliation of teeth due to systemic causes: Secondary | ICD-10-CM | POA: Diagnosis not present

## 2019-11-04 ENCOUNTER — Other Ambulatory Visit: Payer: Self-pay | Admitting: Internal Medicine

## 2019-11-04 DIAGNOSIS — Z1231 Encounter for screening mammogram for malignant neoplasm of breast: Secondary | ICD-10-CM

## 2019-11-19 ENCOUNTER — Ambulatory Visit: Payer: Federal, State, Local not specified - PPO

## 2019-12-07 ENCOUNTER — Ambulatory Visit
Admission: RE | Admit: 2019-12-07 | Discharge: 2019-12-07 | Disposition: A | Discharge status: Home / Self Care | Payer: Federal, State, Local not specified - PPO | Source: Ambulatory Visit | Attending: Internal Medicine | Admitting: Internal Medicine

## 2019-12-07 ENCOUNTER — Other Ambulatory Visit: Payer: Self-pay

## 2019-12-07 DIAGNOSIS — Z1231 Encounter for screening mammogram for malignant neoplasm of breast: Secondary | ICD-10-CM

## 2020-02-10 ENCOUNTER — Encounter: Payer: Self-pay | Admitting: Physician Assistant

## 2020-02-10 ENCOUNTER — Ambulatory Visit: Payer: Federal, State, Local not specified - PPO | Admitting: Physician Assistant

## 2020-02-10 ENCOUNTER — Other Ambulatory Visit (INDEPENDENT_AMBULATORY_CARE_PROVIDER_SITE_OTHER): Payer: Federal, State, Local not specified - PPO

## 2020-02-10 VITALS — BP 124/72 | HR 64 | Ht 65.0 in | Wt 204.0 lb

## 2020-02-10 DIAGNOSIS — K5732 Diverticulitis of large intestine without perforation or abscess without bleeding: Secondary | ICD-10-CM

## 2020-02-10 LAB — CBC WITH DIFFERENTIAL/PLATELET
Basophils Absolute: 0 10*3/uL (ref 0.0–0.1)
Basophils Relative: 0.6 % (ref 0.0–3.0)
Eosinophils Absolute: 0.1 10*3/uL (ref 0.0–0.7)
Eosinophils Relative: 2.4 % (ref 0.0–5.0)
HCT: 39.8 % (ref 36.0–46.0)
Hemoglobin: 13.5 g/dL (ref 12.0–15.0)
Lymphocytes Relative: 25 % (ref 12.0–46.0)
Lymphs Abs: 1.4 10*3/uL (ref 0.7–4.0)
MCHC: 34 g/dL (ref 30.0–36.0)
MCV: 93 fl (ref 78.0–100.0)
Monocytes Absolute: 0.3 10*3/uL (ref 0.1–1.0)
Monocytes Relative: 5.6 % (ref 3.0–12.0)
Neutro Abs: 3.8 10*3/uL (ref 1.4–7.7)
Neutrophils Relative %: 66.4 % (ref 43.0–77.0)
Platelets: 176 10*3/uL (ref 150.0–400.0)
RBC: 4.28 Mil/uL (ref 3.87–5.11)
RDW: 13 % (ref 11.5–15.5)
WBC: 5.8 10*3/uL (ref 4.0–10.5)

## 2020-02-10 MED ORDER — CIPROFLOXACIN HCL 500 MG PO TABS: 500.0000 mg | ORAL_TABLET | Freq: Two times a day (BID) | ORAL | 0 refills | Status: AC

## 2020-02-10 MED ORDER — METRONIDAZOLE 500 MG PO TABS: 500.0000 mg | ORAL_TABLET | Freq: Two times a day (BID) | ORAL | 0 refills | Status: AC

## 2020-02-10 NOTE — Patient Instructions (Signed)
If you are age 60 or older, your body mass index should be between 23-30. Your Body mass index is 33.95 kg/m. If this is out of the aforementioned range listed, please consider follow up with your Primary Care Provider.  If you are age 75 or younger, your body mass index should be between 19-25. Your Body mass index is 33.95 kg/m. If this is out of the aformentioned range listed, please consider follow up with your Primary Care Provider.   Your provider has requested that you go to the basement level for lab work before leaving today. Press "B" on the elevator. The lab is located at the first door on the left as you exit the elevator.  START Miralax 1 grams in 8 ounces of water or juice daily as needed.  START Cipro 500 mg 1 tablet twice daily for 14 days START Flagyl 500 mg 1 tablet twice daily for 14 days  Push fluids.  Call the office in 3-4 days if you are not feeling better or at any point if pain worsens.  Thank you for entrusting me with your care and choosing [facility].  Amy Esterwood, PA-C

## 2020-02-10 NOTE — Progress Notes (Signed)
Subjective:    Patient ID: Eileen Andrews, female    DOB: 12-16-1960, 59 y.o.   MRN: [id number]  HPI Eileen Andrews is a pleasant 59 year old white female, established with Dr. Hilarie Fredrickson who has history of adenomatous colon polyps, diverticulosis and prior history of diverticulitis.  She was last seen in our office in January 2021 with an episode of diverticulitis. She comes in today stating that she has been feeling ill over the past week.  She says the lower abdominal pain is the same type of feeling she has had with prior episodes of diverticulitis.  She has had some progression of symptoms over the past 4 to 5 days.  She describes a dull fairly constant pain in the left lower abdomen sometimes radiating around into her back.  This is been particularly bothersome at night and she is having a hard time sleeping due to discomfort.  She has not had any documented fever or chills, no melena or hematochezia.  She has had some mild constipation associated.  She has also noticed some intermittent nausea and has had some episodes of sour brash or reflux which are unusual for her.  No dysuria or hematuria. She is on omeprazole 20 mg daily for GERD.  Last colonoscopy was done September 2017 for history of sessile serrated polyp.  She was found to have a 6 mm polyp in the ascending colon and multiple diverticuli in the sigmoid to transverse colon.  Path on the polyp consistent with tubular adenoma and she is indicated for 5-year interval follow-up.  Review of Systems Pertinent positive and negative review of systems were noted in the above HPI section.  All other review of systems was otherwise negative.  Outpatient Encounter Medications as of 02/10/2020  Medication Sig  . calcium carbonate (TUMS) 500 MG chewable tablet Chew 1 tablet by mouth as needed for indigestion or heartburn.  . losartan (COZAAR) 50 MG tablet Take 50 mg by mouth daily.  Marland Kitchen omeprazole (PRILOSEC) 20 MG capsule Take 20 mg by mouth as needed.   . [DISCONTINUED] amoxicillin-clavulanate (AUGMENTIN) 875-125 MG tablet Take 1 tablet by mouth 2 (two) times daily.  . ciprofloxacin (CIPRO) 500 MG tablet Take 1 tablet (500 mg total) by mouth 2 (two) times daily for 14 days.  . metroNIDAZOLE (FLAGYL) 500 MG tablet Take 1 tablet (500 mg total) by mouth 2 (two) times daily for 14 days.  . [DISCONTINUED] 0.9 %  sodium chloride infusion    No facility-administered encounter medications on file as of 02/10/2020.   No Known Allergies Patient Active Problem List   Diagnosis Date Noted  . LLQ abdominal pain 03/24/2019  . Diverticulitis of colon 03/24/2019   Social History   Socioeconomic History  . Marital status: Married    Spouse name: Not on file  . Number of children: Not on file  . Years of education: Not on file  . Highest education level: Not on file  Occupational History  . Not on file  Tobacco Use  . Smoking status: Never Smoker  . Smokeless tobacco: Never Used  Vaping Use  . Vaping Use: Never used  Substance and Sexual Activity  . Alcohol use: Yes    Alcohol/week: 7.0 - 8.0 standard drinks    Types: 7 - 8 Glasses of wine per week  . Drug use: No  . Sexual activity: Not on file  Other Topics Concern  . Not on file  Social History Narrative  . Not on file   Social Determinants  of Health   Financial Resource Strain:   . Difficulty of Paying Living Expenses: Not on file  Food Insecurity:   . Worried About Charity fundraiser in the Last Year: Not on file  . Ran Out of Food in the Last Year: Not on file  Transportation Needs:   . Lack of Transportation (Medical): Not on file  . Lack of Transportation (Non-Medical): Not on file  Physical Activity:   . Days of Exercise per Week: Not on file  . Minutes of Exercise per Session: Not on file  Stress:   . Feeling of Stress : Not on file  Social Connections:   . Frequency of Communication with Friends and Family: Not on file  . Frequency of Social Gatherings with Friends  and Family: Not on file  . Attends Religious Services: Not on file  . Active Member of Clubs or Organizations: Not on file  . Attends Archivist Meetings: Not on file  . Marital Status: Not on file  Intimate Partner Violence:   . Fear of Current or Ex-Partner: Not on file  . Emotionally Abused: Not on file  . Physically Abused: Not on file  . Sexually Abused: Not on file    Eileen Andrews's family history is not on file.      Objective:    Vitals:   02/10/20 0903  BP: 124/72  Pulse: 64    Physical Exam Well-developed well-nourished older WF in no acute distress.  Height, Weight,2204  BMI33.9  HEENT; nontraumatic normocephalic, EOMI, PE RR LA, sclera anicteric. Oropharynx; not examined today Neck; supple, no JVD Cardiovascular; regular rate and rhythm with S1-S2, no murmur rub or gallop Pulmonary; Clear bilaterally Abdomen; soft, she is tender in the left mid and left lower quadrant without rebound nondistended, no palpable mass or hepatosplenomegaly, bowel sounds are active Rectal; not done today Skin; benign exam, no jaundice rash or appreciable lesions Extremities; no clubbing cyanosis or edema skin warm and dry Neuro/Psych; alert and oriented x4, grossly nonfocal mood and affect appropriate       Assessment & Plan:   #109 59 year old white female with previous history of diverticulitis and documented sigmoid to transverse colon diverticulosis who comes in with 1 week history of left lower quadrant pain radiating into the back, mild constipation, intermittent nausea and some increase in GERD type symptoms. She does have significant tenderness in the left lower quadrant without guarding or rebound Symptoms are consistent with recurrent diverticulitis.  #2 history of adenomatous colon polyps-up to date with colonoscopy last done in September 2017 with removal of a 6 mm ascending colon tubular adenoma and indicated for 5-year interval follow-up #3 GERD-had been  using 20 mg omeprazole as needed  Plan; start Cipro 500 mg p.o. twice daily with food Start metronidazole 500 mg p.o. twice daily with food, both to be taken for 10 days. Start omeprazole 20 mg p.o. every morning regularly. Offered an antiemetic, she does not feel that she needs at present. We discussed use of MiraLAX on a as needed basis especially during episodes of diverticulitis. Push fluids and small bland meals Patient is asked to call if she does not have any significant improvement in symptoms over the next 3 to 4 days or if at any point she develops worsening in pain and at that point would need CT imaging.  Berle Fitz S Cheick Suhr PA-C 02/10/2020   Cc: Velna Hatchet, MD

## 2020-02-11 NOTE — Progress Notes (Signed)
Addendum: Reviewed and agree with assessment and management plan. Abdoul Encinas M, MD  

## 2020-02-23 ENCOUNTER — Other Ambulatory Visit: Payer: Self-pay

## 2020-02-23 DIAGNOSIS — R1032 Left lower quadrant pain: Secondary | ICD-10-CM

## 2020-02-23 DIAGNOSIS — K5732 Diverticulitis of large intestine without perforation or abscess without bleeding: Secondary | ICD-10-CM

## 2020-02-23 NOTE — Telephone Encounter (Signed)
CT abd/pelvis with IV contrast, LLQ pain not resolved with abx x 10 days Would order ASAP Bentyl 20 mg TIDPRN pain

## 2020-02-24 ENCOUNTER — Other Ambulatory Visit (INDEPENDENT_AMBULATORY_CARE_PROVIDER_SITE_OTHER): Payer: Federal, State, Local not specified - PPO

## 2020-02-24 ENCOUNTER — Other Ambulatory Visit: Payer: Self-pay

## 2020-02-24 DIAGNOSIS — R1032 Left lower quadrant pain: Secondary | ICD-10-CM

## 2020-02-24 LAB — BASIC METABOLIC PANEL
BUN: 14 mg/dL (ref 6–23)
CO2: 28 mEq/L (ref 19–32)
Calcium: 9.6 mg/dL (ref 8.4–10.5)
Chloride: 104 mEq/L (ref 96–112)
Creatinine, Ser: 0.81 mg/dL (ref 0.40–1.20)
GFR: 79.02 mL/min (ref >60.00)
Glucose, Bld: 100 mg/dL — ABNORMAL HIGH (ref 70–99)
Potassium: 4.3 mEq/L (ref 3.5–5.1)
Sodium: 140 mEq/L (ref 135–145)

## 2020-02-25 ENCOUNTER — Other Ambulatory Visit: Payer: Self-pay

## 2020-02-25 ENCOUNTER — Ambulatory Visit (INDEPENDENT_AMBULATORY_CARE_PROVIDER_SITE_OTHER)
Admission: RE | Admit: 2020-02-25 | Discharge: 2020-02-25 | Disposition: A | Discharge status: Home / Self Care | Payer: Federal, State, Local not specified - PPO | Source: Ambulatory Visit | Attending: Internal Medicine | Admitting: Internal Medicine

## 2020-02-25 DIAGNOSIS — K314 Gastric diverticulum: Secondary | ICD-10-CM | POA: Diagnosis not present

## 2020-02-25 DIAGNOSIS — R1032 Left lower quadrant pain: Secondary | ICD-10-CM

## 2020-02-25 DIAGNOSIS — K5730 Diverticulosis of large intestine without perforation or abscess without bleeding: Secondary | ICD-10-CM | POA: Diagnosis not present

## 2020-02-25 DIAGNOSIS — R197 Diarrhea, unspecified: Secondary | ICD-10-CM | POA: Diagnosis not present

## 2020-02-25 MED ORDER — IOHEXOL 300 MG/ML  SOLN
100.0000 mL | Freq: Once | INTRAMUSCULAR | Status: AC | PRN
  Administered 2020-02-25: 100 mL via INTRAVENOUS

## 2020-03-06 DIAGNOSIS — U071 COVID-19: Secondary | ICD-10-CM | POA: Diagnosis not present

## 2020-04-06 DIAGNOSIS — E785 Hyperlipidemia, unspecified: Secondary | ICD-10-CM | POA: Diagnosis not present

## 2020-04-13 DIAGNOSIS — I10 Essential (primary) hypertension: Secondary | ICD-10-CM | POA: Diagnosis not present

## 2020-04-13 DIAGNOSIS — E785 Hyperlipidemia, unspecified: Secondary | ICD-10-CM | POA: Diagnosis not present

## 2020-04-13 DIAGNOSIS — Z0000 Encounter for general adult medical examination without abnormal findings: Secondary | ICD-10-CM | POA: Diagnosis not present

## 2020-05-05 DIAGNOSIS — L905 Scar conditions and fibrosis of skin: Secondary | ICD-10-CM | POA: Diagnosis not present

## 2020-05-05 DIAGNOSIS — D485 Neoplasm of uncertain behavior of skin: Secondary | ICD-10-CM | POA: Diagnosis not present

## 2020-05-05 DIAGNOSIS — L821 Other seborrheic keratosis: Secondary | ICD-10-CM | POA: Diagnosis not present

## 2020-05-05 DIAGNOSIS — D225 Melanocytic nevi of trunk: Secondary | ICD-10-CM | POA: Diagnosis not present

## 2020-05-05 DIAGNOSIS — D499 Neoplasm of unspecified behavior of unspecified site: Secondary | ICD-10-CM | POA: Diagnosis not present

## 2020-05-27 DIAGNOSIS — D485 Neoplasm of uncertain behavior of skin: Secondary | ICD-10-CM | POA: Diagnosis not present

## 2020-05-27 DIAGNOSIS — L988 Other specified disorders of the skin and subcutaneous tissue: Secondary | ICD-10-CM | POA: Diagnosis not present

## 2020-09-21 DIAGNOSIS — N390 Urinary tract infection, site not specified: Secondary | ICD-10-CM | POA: Diagnosis not present

## 2020-09-21 DIAGNOSIS — R109 Unspecified abdominal pain: Secondary | ICD-10-CM | POA: Diagnosis not present

## 2020-10-19 ENCOUNTER — Other Ambulatory Visit: Payer: Self-pay | Admitting: Internal Medicine

## 2020-10-19 DIAGNOSIS — R112 Nausea with vomiting, unspecified: Secondary | ICD-10-CM

## 2020-10-19 DIAGNOSIS — R109 Unspecified abdominal pain: Secondary | ICD-10-CM

## 2020-10-21 ENCOUNTER — Other Ambulatory Visit: Payer: Self-pay

## 2020-10-21 ENCOUNTER — Ambulatory Visit
Admission: RE | Admit: 2020-10-21 | Discharge: 2020-10-21 | Disposition: A | Discharge status: Home / Self Care | Payer: Federal, State, Local not specified - PPO | Source: Ambulatory Visit | Attending: Internal Medicine | Admitting: Internal Medicine

## 2020-10-21 DIAGNOSIS — J8410 Pulmonary fibrosis, unspecified: Secondary | ICD-10-CM | POA: Diagnosis not present

## 2020-10-21 DIAGNOSIS — R109 Unspecified abdominal pain: Secondary | ICD-10-CM

## 2020-10-21 DIAGNOSIS — K769 Liver disease, unspecified: Secondary | ICD-10-CM | POA: Diagnosis not present

## 2020-10-21 DIAGNOSIS — K5730 Diverticulosis of large intestine without perforation or abscess without bleeding: Secondary | ICD-10-CM | POA: Diagnosis not present

## 2020-10-21 DIAGNOSIS — R112 Nausea with vomiting, unspecified: Secondary | ICD-10-CM | POA: Diagnosis not present

## 2020-10-21 MED ORDER — IOPAMIDOL (ISOVUE-300) INJECTION 61%
100.0000 mL | Freq: Once | INTRAVENOUS | Status: AC | PRN
  Administered 2020-10-21: 100 mL via INTRAVENOUS

## 2020-10-27 ENCOUNTER — Ambulatory Visit: Payer: Federal, State, Local not specified - PPO | Admitting: Physician Assistant

## 2020-11-03 ENCOUNTER — Ambulatory Visit: Payer: Federal, State, Local not specified - PPO | Admitting: Physician Assistant

## 2020-11-10 ENCOUNTER — Other Ambulatory Visit (HOSPITAL_COMMUNITY)
Admission: RE | Admit: 2020-11-10 | Discharge: 2020-11-10 | Disposition: A | Discharge status: Home / Self Care | Payer: Federal, State, Local not specified - PPO | Source: Ambulatory Visit | Attending: Obstetrics and Gynecology | Admitting: Obstetrics and Gynecology

## 2020-11-10 ENCOUNTER — Ambulatory Visit (INDEPENDENT_AMBULATORY_CARE_PROVIDER_SITE_OTHER): Payer: Federal, State, Local not specified - PPO | Admitting: Obstetrics and Gynecology

## 2020-11-10 ENCOUNTER — Other Ambulatory Visit: Payer: Self-pay

## 2020-11-10 ENCOUNTER — Encounter: Payer: Self-pay | Admitting: Obstetrics and Gynecology

## 2020-11-10 VITALS — Ht 65.0 in | Wt 200.0 lb

## 2020-11-10 DIAGNOSIS — Z01419 Encounter for gynecological examination (general) (routine) without abnormal findings: Secondary | ICD-10-CM

## 2020-11-10 DIAGNOSIS — Z1239 Encounter for other screening for malignant neoplasm of breast: Secondary | ICD-10-CM | POA: Diagnosis not present

## 2020-11-10 NOTE — Progress Notes (Signed)
GYNECOLOGY ANNUAL PREVENTATIVE CARE ENCOUNTER NOTE  History:     Eileen Andrews is a 60 y.o. [phone] female here for a routine annual gynecologic exam.  Current complaints: none.   Denies abnormal vaginal bleeding, discharge, pelvic pain, problems with intercourse or other gynecologic concerns.   She is occasional sexually active with her spouse without complaint.      Gynecologic History Patient's last menstrual period was 03/12/2008. Last Pap: a few years ago per pt. Result was normal with negative HPV Last Mammogram: 11/2019.  Result was normal Last Colonoscopy: 2017.  Result was normal except diverticulosis noted  Obstetric History OB History  Gravida Para Term Preterm AB Living  '7 5 5   2 5  '$ SAB IAB Ectopic Multiple Live Births  2            # Outcome Date GA Lbr Len/2nd Weight Sex Delivery Anes PTL Lv  7 SAB           6 SAB           5 Term           4 Term           3 Term           2 Term           1 Term             Past Medical History:  Diagnosis Date   Acid reflux    Heart murmur    Hemorrhoids    Vaginal Pap smear, abnormal     Past Surgical History:  Procedure Laterality Date   APPENDECTOMY     COLONOSCOPY  05/2012   COLPOSCOPY     TUBAL LIGATION     VAGINAL DELIVERY     WISDOM TOOTH EXTRACTION      Current Outpatient Medications on File Prior to Visit  Medication Sig Dispense Refill   losartan (COZAAR) 50 MG tablet Take 50 mg by mouth daily.     calcium carbonate (TUMS - DOSED IN MG ELEMENTAL CALCIUM) 500 MG chewable tablet Chew 1 tablet by mouth as needed for indigestion or heartburn. (Patient not taking: Reported on 11/10/2020)     omeprazole (PRILOSEC) 20 MG capsule Take 20 mg by mouth as needed. (Patient not taking: Reported on 11/10/2020)     No current facility-administered medications on file prior to visit.    No Known Allergies  Social History:  reports that she has never smoked. She has never used smokeless tobacco. She  reports current alcohol use of about 7.0 - 8.0 standard drinks per week. She reports that she does not use drugs.  Family History  Problem Relation Age of Onset   Colon cancer Neg Hx    Stomach cancer Neg Hx    Esophageal cancer Neg Hx    Pancreatic cancer Neg Hx     The following portions of the patient's history were reviewed and updated as appropriate: allergies, current medications, past family history, past medical history, past social history, past surgical history and problem list.  Review of Systems Pertinent items noted in HPI and remainder of comprehensive ROS otherwise negative.  Physical Exam:  Ht '5\' 5"'$  (1.651 m)   Wt 200 lb (90.7 kg)   LMP 03/12/2008   BMI 33.28 kg/m  CONSTITUTIONAL: Well-developed, well-nourished female in no acute distress.  HENT:  Normocephalic, atraumatic, External right and left ear normal.  EYES: Conjunctivae and EOM are normal. Pupils are  equal, round, and reactive to light. No scleral icterus.  NECK: Normal range of motion, supple, no masses.  Normal thyroid.  SKIN: Skin is warm and dry. No rash noted. Not diaphoretic. No erythema. No pallor. MUSCULOSKELETAL: Normal range of motion. No tenderness.  No cyanosis, clubbing, or edema. NEUROLOGIC: Alert and oriented to person, place, and time. Normal reflexes, muscle tone coordination.  PSYCHIATRIC: Normal mood and affect. Normal behavior. Normal judgment and thought content.  CARDIOVASCULAR: Normal heart rate noted, regular rhythm RESPIRATORY: Clear to auscultation bilaterally. Effort and breath sounds normal, no problems with respiration noted.  BREASTS: Symmetric in size. No masses, tenderness, skin changes, nipple drainage, or lymphadenopathy bilaterally. Performed in the presence of a chaperone. ABDOMEN: Soft, no distention noted.  No tenderness, rebound or guarding.  PELVIC: External genitalia normal, Vagina normal without discharge, Urethra without abnormality or discharge, no bladder  tenderness, cervix normal in appearance, no CMT, uterus normal size, shape, and consistency, no adnexal masses or tenderness, exam obscured by obesity. Performed in the presence of a chaperone.    Assessment and Plan:    1. Encounter for screening for malignant neoplasm of breast, unspecified screening modality - MM Digital Screening; Future  2. Encounter for annual routine gynecological examination - Cervical cancer screening: Discussed guidelines. Pap with HPV done    - Breast Health: Encouraged self breast awareness/SBE. Discussed limits of clinical breast exam for detecting breast cancer. Discussed importance of annual MXR.  Rx given - Climacteric/Sexual health: Reviewed typical and atypical symptoms of menopause/peri-menopause. Discussed PMB and to call if any amount of spotting.  - Bone Health: Calcium via diet and supplementation. Discussed weight bearing exercise. DEXA due  at age 88 or per pcp - Colonoscopy: up to date - F/U 12 months and prn  Routine preventative health maintenance measures emphasized. Please refer to After Visit Summary for other counseling recommendations.      Radene Gunning, MD, [place] [facility] [place], [place]

## 2020-11-10 NOTE — Progress Notes (Signed)
BP machine didn't pick up pt's BP at the beginning of the visit. When I attempted to take BP before pt left office she stated she needed to get back to work and I was unable to take pt BP.

## 2020-11-24 LAB — CYTOLOGY - PAP
Comment: NEGATIVE
Comment: NEGATIVE
Diagnosis: UNDETERMINED — AB
HPV 16: NEGATIVE
HPV 18 / 45: NEGATIVE
High risk HPV: POSITIVE — AB

## 2020-11-29 ENCOUNTER — Ambulatory Visit: Payer: Federal, State, Local not specified - PPO | Admitting: Physician Assistant

## 2020-12-08 ENCOUNTER — Ambulatory Visit: Payer: Federal, State, Local not specified - PPO

## 2020-12-12 NOTE — Progress Notes (Signed)
    GYNECOLOGY OFFICE COLPOSCOPY PROCEDURE NOTE  60 y.o. [id number] here for colposcopy for ASCUS with POSITIVE high risk HPV pap smear on 11/10/20.   Pregnancy test:  not indicated Gardasil:   did not have . Discussed role for HPV in cervical dysplasia, need for surveillance.  Informed consent and review of risks, benefit and alternatives performed. Written consent given.   Speculum inserted into patient's vagina assuring full view of cervix and vaginal walls. 3 swabs of vinegar solution applied to the cervix and vaginal walls and colposcope was used to observe both the cervix and vaginal walls.   Colposcopy adequate? Yes  no visible lesions, no mosaicism, no punctation, and no abnormal vasculature; corresponding biopsies obtained.  ECC collected.  All specimens were labeled and sent to pathology.  Monsel's applied to biopsy sites for good hemostasis and speculum removed.  Pt tolerated well with minimal pain and bleeding.   Patient was given post procedure instructions.  Will follow up pathology and manage accordingly; patient will be contacted with results and recommendations.  Routine preventative health maintenance measures emphasized.    Radene Gunning, MD, [place] [facility], [place]

## 2020-12-15 ENCOUNTER — Encounter: Payer: Self-pay | Admitting: Obstetrics and Gynecology

## 2020-12-15 ENCOUNTER — Ambulatory Visit: Payer: Federal, State, Local not specified - PPO | Admitting: Obstetrics and Gynecology

## 2020-12-15 ENCOUNTER — Other Ambulatory Visit (HOSPITAL_COMMUNITY)
Admission: RE | Admit: 2020-12-15 | Discharge: 2020-12-15 | Disposition: A | Discharge status: Home / Self Care | Payer: Federal, State, Local not specified - PPO | Source: Ambulatory Visit | Attending: Obstetrics and Gynecology | Admitting: Obstetrics and Gynecology

## 2020-12-15 ENCOUNTER — Other Ambulatory Visit: Payer: Self-pay

## 2020-12-15 VITALS — BP 152/95 | HR 74 | Ht 65.0 in | Wt 200.5 lb

## 2020-12-15 DIAGNOSIS — R87810 Cervical high risk human papillomavirus (HPV) DNA test positive: Secondary | ICD-10-CM

## 2020-12-15 DIAGNOSIS — R87610 Atypical squamous cells of undetermined significance on cytologic smear of cervix (ASC-US): Secondary | ICD-10-CM | POA: Insufficient documentation

## 2020-12-19 LAB — SURGICAL PATHOLOGY

## 2020-12-22 DIAGNOSIS — F4322 Adjustment disorder with anxiety: Secondary | ICD-10-CM | POA: Diagnosis not present

## 2020-12-29 ENCOUNTER — Ambulatory Visit (INDEPENDENT_AMBULATORY_CARE_PROVIDER_SITE_OTHER): Payer: Federal, State, Local not specified - PPO

## 2020-12-29 ENCOUNTER — Other Ambulatory Visit: Payer: Self-pay

## 2020-12-29 DIAGNOSIS — Z1239 Encounter for other screening for malignant neoplasm of breast: Secondary | ICD-10-CM | POA: Diagnosis not present

## 2020-12-29 DIAGNOSIS — Z01419 Encounter for gynecological examination (general) (routine) without abnormal findings: Secondary | ICD-10-CM

## 2020-12-29 DIAGNOSIS — Z1231 Encounter for screening mammogram for malignant neoplasm of breast: Secondary | ICD-10-CM | POA: Diagnosis not present

## 2021-01-04 ENCOUNTER — Other Ambulatory Visit: Payer: Self-pay | Admitting: Obstetrics and Gynecology

## 2021-01-04 DIAGNOSIS — R928 Other abnormal and inconclusive findings on diagnostic imaging of breast: Secondary | ICD-10-CM

## 2021-01-13 ENCOUNTER — Ambulatory Visit (HOSPITAL_BASED_OUTPATIENT_CLINIC_OR_DEPARTMENT_OTHER): Payer: Federal, State, Local not specified - PPO | Admitting: Orthopaedic Surgery

## 2021-01-13 DIAGNOSIS — F4322 Adjustment disorder with anxiety: Secondary | ICD-10-CM | POA: Diagnosis not present

## 2021-01-14 ENCOUNTER — Ambulatory Visit
Admission: RE | Admit: 2021-01-14 | Discharge: 2021-01-14 | Disposition: A | Discharge status: Home / Self Care | Payer: Federal, State, Local not specified - PPO | Source: Ambulatory Visit | Attending: Obstetrics and Gynecology | Admitting: Obstetrics and Gynecology

## 2021-01-14 ENCOUNTER — Other Ambulatory Visit: Payer: Self-pay | Admitting: Obstetrics and Gynecology

## 2021-01-14 DIAGNOSIS — R922 Inconclusive mammogram: Secondary | ICD-10-CM | POA: Diagnosis not present

## 2021-01-14 DIAGNOSIS — R928 Other abnormal and inconclusive findings on diagnostic imaging of breast: Secondary | ICD-10-CM

## 2021-01-14 DIAGNOSIS — R921 Mammographic calcification found on diagnostic imaging of breast: Secondary | ICD-10-CM

## 2021-01-25 ENCOUNTER — Other Ambulatory Visit: Payer: Self-pay

## 2021-01-25 ENCOUNTER — Ambulatory Visit
Admission: RE | Admit: 2021-01-25 | Discharge: 2021-01-25 | Disposition: A | Discharge status: Home / Self Care | Payer: Federal, State, Local not specified - PPO | Source: Ambulatory Visit | Attending: Obstetrics and Gynecology | Admitting: Obstetrics and Gynecology

## 2021-01-25 DIAGNOSIS — R921 Mammographic calcification found on diagnostic imaging of breast: Secondary | ICD-10-CM | POA: Diagnosis not present

## 2021-01-25 DIAGNOSIS — N6011 Diffuse cystic mastopathy of right breast: Secondary | ICD-10-CM | POA: Diagnosis not present

## 2021-02-10 DIAGNOSIS — F4322 Adjustment disorder with anxiety: Secondary | ICD-10-CM | POA: Diagnosis not present

## 2021-04-19 DIAGNOSIS — E785 Hyperlipidemia, unspecified: Secondary | ICD-10-CM | POA: Diagnosis not present

## 2021-04-26 DIAGNOSIS — Z0000 Encounter for general adult medical examination without abnormal findings: Secondary | ICD-10-CM | POA: Diagnosis not present

## 2021-04-26 DIAGNOSIS — I10 Essential (primary) hypertension: Secondary | ICD-10-CM | POA: Diagnosis not present

## 2021-05-18 ENCOUNTER — Encounter: Payer: Self-pay | Admitting: Internal Medicine

## 2021-06-05 ENCOUNTER — Ambulatory Visit (AMBULATORY_SURGERY_CENTER): Payer: Federal, State, Local not specified - PPO | Admitting: *Deleted

## 2021-06-05 ENCOUNTER — Other Ambulatory Visit: Payer: Self-pay

## 2021-06-05 VITALS — Ht 65.0 in | Wt 200.0 lb

## 2021-06-05 DIAGNOSIS — Z86010 Personal history of colonic polyps: Secondary | ICD-10-CM

## 2021-06-05 MED ORDER — NA SULFATE-K SULFATE-MG SULF 17.5-3.13-1.6 GM/177ML PO SOLN: 1.0000 | Freq: Once | ORAL | 0 refills | Status: AC

## 2021-06-05 NOTE — Progress Notes (Signed)

## 2021-06-06 DIAGNOSIS — M549 Dorsalgia, unspecified: Secondary | ICD-10-CM | POA: Diagnosis not present

## 2021-06-24 ENCOUNTER — Encounter: Payer: Self-pay | Admitting: Obstetrics and Gynecology

## 2021-06-26 ENCOUNTER — Encounter: Payer: Federal, State, Local not specified - PPO | Admitting: Internal Medicine

## 2021-06-28 ENCOUNTER — Other Ambulatory Visit: Payer: Self-pay | Admitting: *Deleted

## 2021-07-06 ENCOUNTER — Other Ambulatory Visit: Payer: Self-pay | Admitting: *Deleted

## 2021-08-01 DIAGNOSIS — R921 Mammographic calcification found on diagnostic imaging of breast: Secondary | ICD-10-CM | POA: Diagnosis not present

## 2021-08-01 DIAGNOSIS — N649 Disorder of breast, unspecified: Secondary | ICD-10-CM | POA: Diagnosis not present

## 2021-08-16 ENCOUNTER — Encounter: Payer: Self-pay | Admitting: *Deleted

## 2021-08-16 NOTE — Progress Notes (Signed)
Pt has moved to [facility] and now seeing GYN closer to home.

## 2021-09-05 ENCOUNTER — Encounter: Payer: Self-pay | Admitting: Internal Medicine

## 2021-09-07 DIAGNOSIS — M5459 Other low back pain: Secondary | ICD-10-CM | POA: Diagnosis not present

## 2021-09-14 DIAGNOSIS — Z0000 Encounter for general adult medical examination without abnormal findings: Secondary | ICD-10-CM | POA: Diagnosis not present

## 2021-09-14 DIAGNOSIS — Z01419 Encounter for gynecological examination (general) (routine) without abnormal findings: Secondary | ICD-10-CM | POA: Diagnosis not present

## 2021-09-20 DIAGNOSIS — Z5181 Encounter for therapeutic drug level monitoring: Secondary | ICD-10-CM | POA: Diagnosis not present

## 2021-09-26 DIAGNOSIS — K648 Other hemorrhoids: Secondary | ICD-10-CM | POA: Diagnosis not present

## 2021-09-26 DIAGNOSIS — Z1211 Encounter for screening for malignant neoplasm of colon: Secondary | ICD-10-CM | POA: Diagnosis not present

## 2021-09-26 DIAGNOSIS — K649 Unspecified hemorrhoids: Secondary | ICD-10-CM | POA: Diagnosis not present

## 2021-09-26 DIAGNOSIS — Z86010 Personal history of colonic polyps: Secondary | ICD-10-CM | POA: Diagnosis not present

## 2021-09-26 DIAGNOSIS — D122 Benign neoplasm of ascending colon: Secondary | ICD-10-CM | POA: Diagnosis not present

## 2021-09-26 DIAGNOSIS — Z8719 Personal history of other diseases of the digestive system: Secondary | ICD-10-CM | POA: Diagnosis not present

## 2021-09-26 DIAGNOSIS — K644 Residual hemorrhoidal skin tags: Secondary | ICD-10-CM | POA: Diagnosis not present

## 2021-09-26 DIAGNOSIS — I10 Essential (primary) hypertension: Secondary | ICD-10-CM | POA: Diagnosis not present

## 2021-09-26 DIAGNOSIS — K635 Polyp of colon: Secondary | ICD-10-CM | POA: Diagnosis not present

## 2021-09-26 DIAGNOSIS — Z9049 Acquired absence of other specified parts of digestive tract: Secondary | ICD-10-CM | POA: Diagnosis not present

## 2021-09-26 DIAGNOSIS — M1990 Unspecified osteoarthritis, unspecified site: Secondary | ICD-10-CM | POA: Diagnosis not present

## 2021-09-26 DIAGNOSIS — K219 Gastro-esophageal reflux disease without esophagitis: Secondary | ICD-10-CM | POA: Diagnosis not present

## 2021-09-26 DIAGNOSIS — K5730 Diverticulosis of large intestine without perforation or abscess without bleeding: Secondary | ICD-10-CM | POA: Diagnosis not present

## 2021-09-26 DIAGNOSIS — Z79899 Other long term (current) drug therapy: Secondary | ICD-10-CM | POA: Diagnosis not present

## 2021-10-17 DIAGNOSIS — L578 Other skin changes due to chronic exposure to nonionizing radiation: Secondary | ICD-10-CM | POA: Diagnosis not present

## 2021-10-17 DIAGNOSIS — L905 Scar conditions and fibrosis of skin: Secondary | ICD-10-CM | POA: Diagnosis not present

## 2021-10-17 DIAGNOSIS — L821 Other seborrheic keratosis: Secondary | ICD-10-CM | POA: Diagnosis not present

## 2021-10-17 DIAGNOSIS — L814 Other melanin hyperpigmentation: Secondary | ICD-10-CM | POA: Diagnosis not present

## 2022-05-02 DIAGNOSIS — I10 Essential (primary) hypertension: Secondary | ICD-10-CM | POA: Diagnosis not present

## 2022-05-02 DIAGNOSIS — Z0000 Encounter for general adult medical examination without abnormal findings: Secondary | ICD-10-CM | POA: Diagnosis not present

## 2022-05-02 DIAGNOSIS — M1990 Unspecified osteoarthritis, unspecified site: Secondary | ICD-10-CM | POA: Diagnosis not present

## 2022-05-02 DIAGNOSIS — K5790 Diverticulosis of intestine, part unspecified, without perforation or abscess without bleeding: Secondary | ICD-10-CM | POA: Diagnosis not present

## 2022-05-18 DIAGNOSIS — I10 Essential (primary) hypertension: Secondary | ICD-10-CM | POA: Diagnosis not present

## 2022-05-18 DIAGNOSIS — Z13220 Encounter for screening for lipoid disorders: Secondary | ICD-10-CM | POA: Diagnosis not present

## 2022-05-24 DIAGNOSIS — K080 Exfoliation of teeth due to systemic causes: Secondary | ICD-10-CM | POA: Diagnosis not present

## 2022-09-19 DIAGNOSIS — Z5181 Encounter for therapeutic drug level monitoring: Secondary | ICD-10-CM | POA: Diagnosis not present

## 2022-10-22 DIAGNOSIS — L918 Other hypertrophic disorders of the skin: Secondary | ICD-10-CM | POA: Diagnosis not present

## 2022-10-22 DIAGNOSIS — L578 Other skin changes due to chronic exposure to nonionizing radiation: Secondary | ICD-10-CM | POA: Diagnosis not present

## 2022-10-22 DIAGNOSIS — L814 Other melanin hyperpigmentation: Secondary | ICD-10-CM | POA: Diagnosis not present

## 2023-02-13 DIAGNOSIS — Z1330 Encounter for screening examination for mental health and behavioral disorders, unspecified: Secondary | ICD-10-CM | POA: Diagnosis not present

## 2023-02-13 DIAGNOSIS — Z1159 Encounter for screening for other viral diseases: Secondary | ICD-10-CM | POA: Diagnosis not present

## 2023-02-13 DIAGNOSIS — I10 Essential (primary) hypertension: Secondary | ICD-10-CM | POA: Diagnosis not present

## 2023-02-13 DIAGNOSIS — E782 Mixed hyperlipidemia: Secondary | ICD-10-CM | POA: Diagnosis not present

## 2023-02-13 DIAGNOSIS — Z1331 Encounter for screening for depression: Secondary | ICD-10-CM | POA: Diagnosis not present

## 2023-02-13 DIAGNOSIS — Z114 Encounter for screening for human immunodeficiency virus [HIV]: Secondary | ICD-10-CM | POA: Diagnosis not present

## 2023-02-13 DIAGNOSIS — Z1329 Encounter for screening for other suspected endocrine disorder: Secondary | ICD-10-CM | POA: Diagnosis not present

## 2023-02-13 DIAGNOSIS — Z131 Encounter for screening for diabetes mellitus: Secondary | ICD-10-CM | POA: Diagnosis not present

## 2023-02-13 DIAGNOSIS — Z136 Encounter for screening for cardiovascular disorders: Secondary | ICD-10-CM | POA: Diagnosis not present

## 2023-02-13 DIAGNOSIS — Z130 Encounter for screening for diseases of the blood and blood-forming organs and certain disorders involving the immune mechanism: Secondary | ICD-10-CM | POA: Diagnosis not present

## 2023-05-26 IMAGING — MG MM BREAST LOCALIZATION CLIP
4 series · 4 of 12 positions shown · non-contrast
Comparison: Previous exam(s).

CLINICAL DATA: Post procedure mammogram for clip placement.

EXAM:
3D DIAGNOSTIC RIGHT MAMMOGRAM POST STEREOTACTIC BIOPSY

[R LM synth-2D]
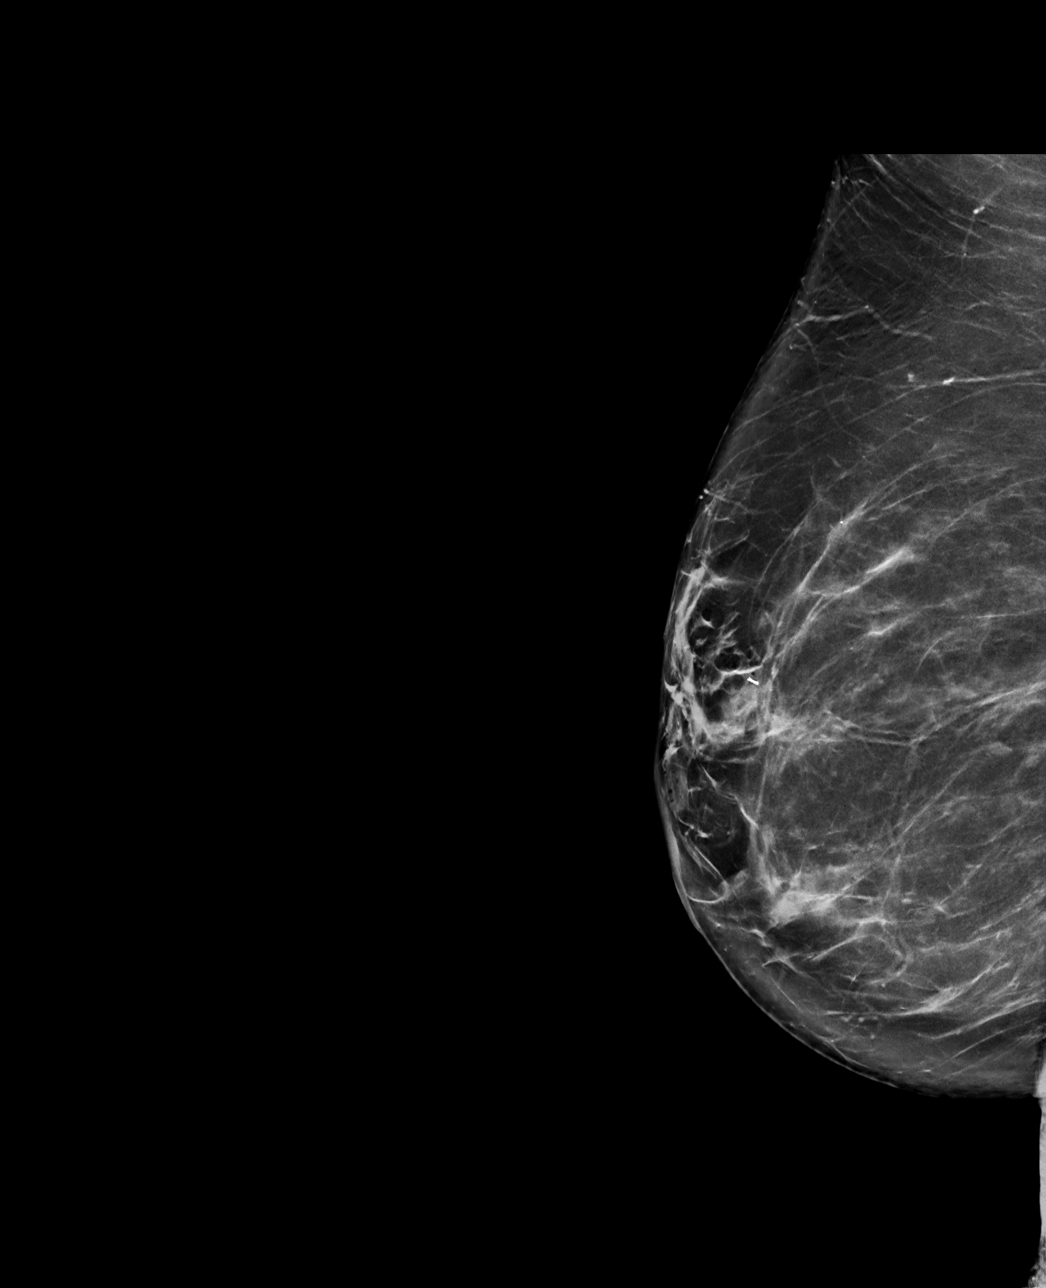

[R CC synth-2D]
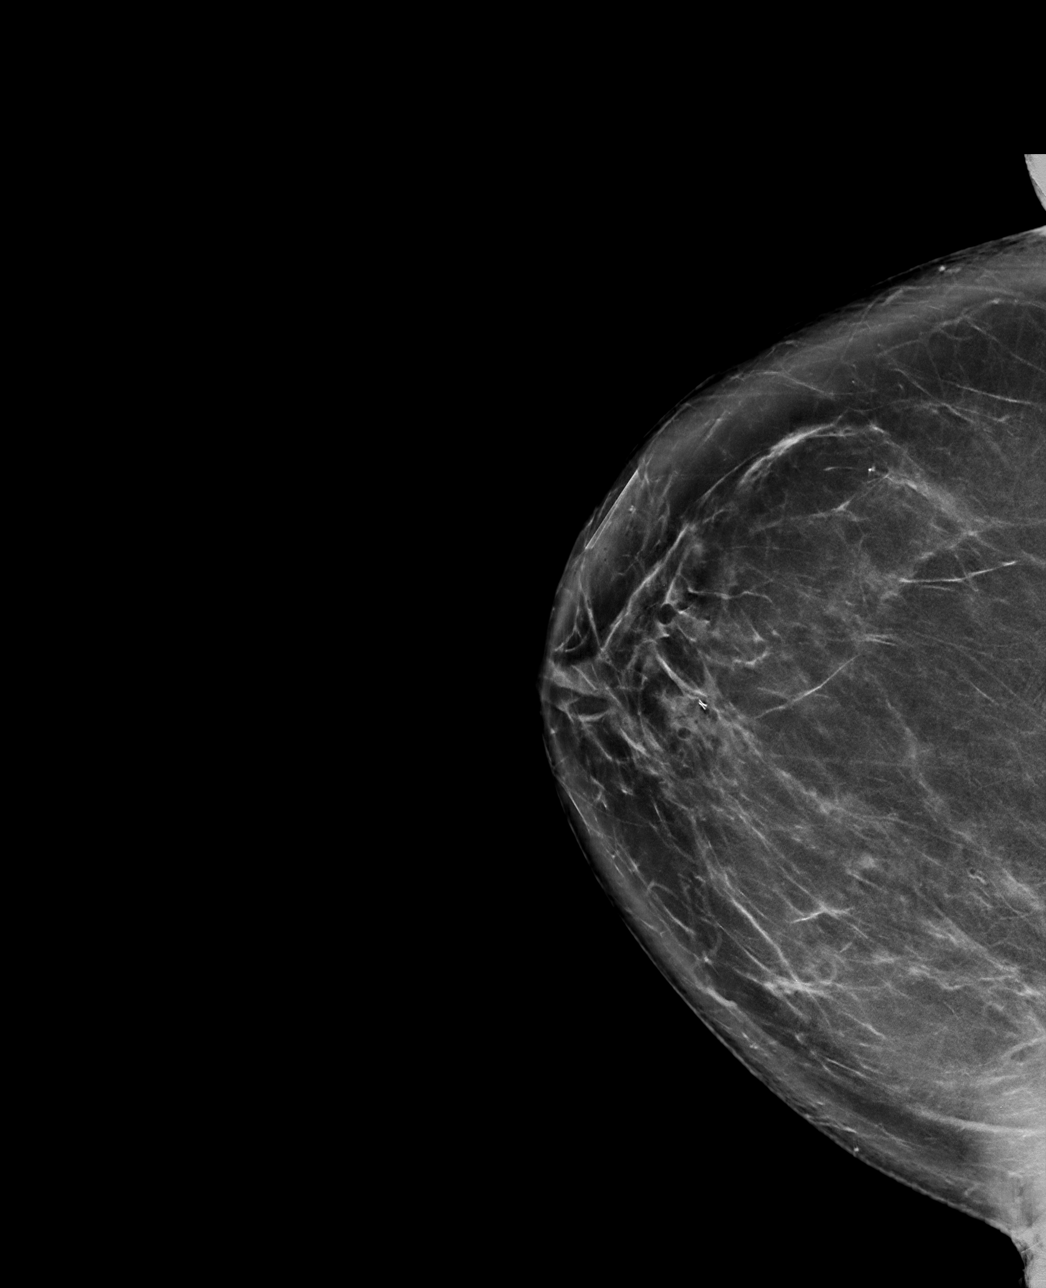

[R CC tomo · tomo slice 45/90.0]
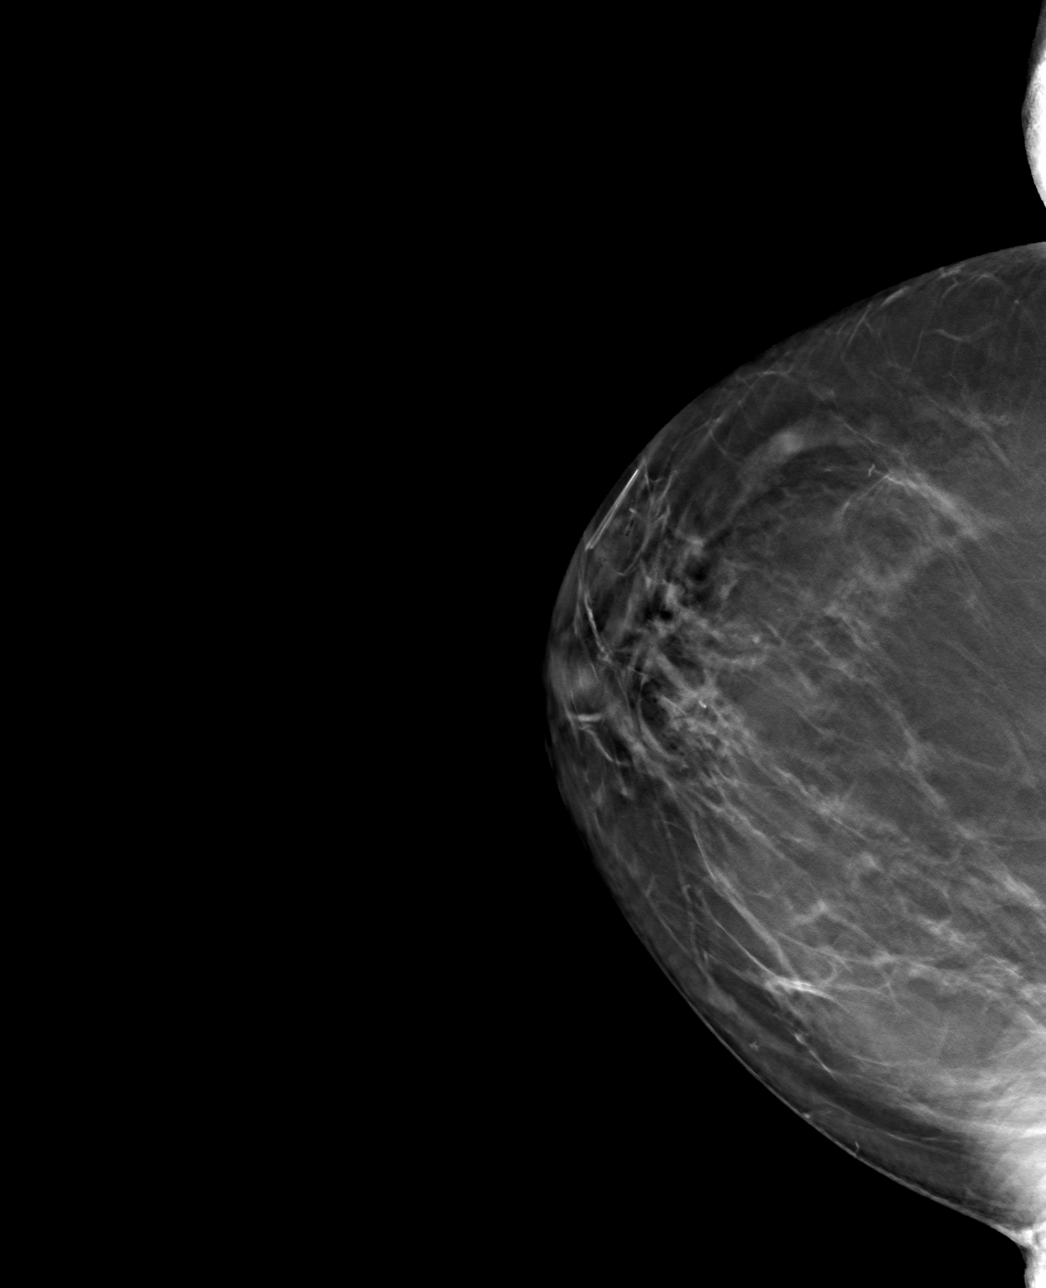

[R LM tomo · tomo slice 43/85.0]
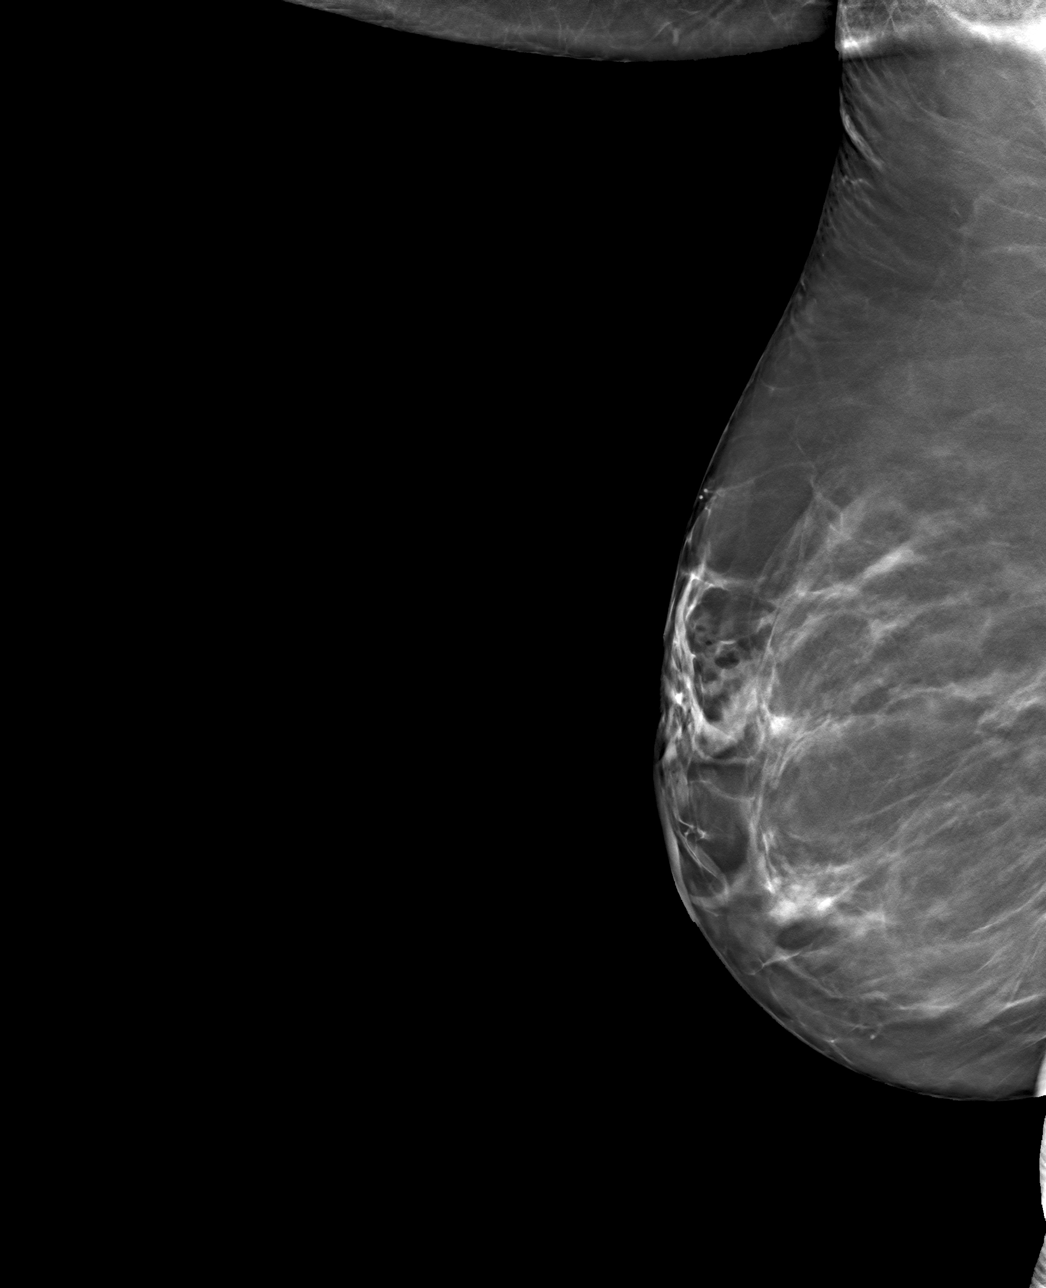

[4 of 12 positions shown; findings below may reference images not displayed]

FINDINGS: 3D Mammographic images were obtained following stereotactic guided
biopsy of calcifications in the central superior right breast. The
biopsy marking clip is in expected position at the site of biopsy.
IMPRESSION: Appropriate positioning of the X shaped biopsy marking clip at the
site of biopsy in the superior central right breast.

Final Assessment: Post Procedure Mammograms for Marker Placement
